# Patient Record
Sex: Male | Born: 2000 | Race: White | Hispanic: No | Marital: Single | Smoking: Never smoker
Health system: Southern US, Academic
[De-identification: ages and names within clinical notes are randomized; demographics above are authoritative.]

## PROBLEM LIST (undated history)

## (undated) DIAGNOSIS — J309 Allergic rhinitis, unspecified: Secondary | ICD-10-CM

## (undated) DIAGNOSIS — F409 Phobic anxiety disorder, unspecified: Secondary | ICD-10-CM

## (undated) DIAGNOSIS — F909 Attention-deficit hyperactivity disorder, unspecified type: Secondary | ICD-10-CM

## (undated) DIAGNOSIS — H18609 Keratoconus, unspecified, unspecified eye: Secondary | ICD-10-CM

## (undated) HISTORY — DX: Keratoconus, unspecified, unspecified eye: H18.609

## (undated) HISTORY — DX: Allergic rhinitis, unspecified: J30.9

## (undated) HISTORY — DX: Attention-deficit hyperactivity disorder, unspecified type: F90.9

## (undated) HISTORY — PX: ADENOIDECTOMY: SUR15

## (undated) HISTORY — PX: TONSILLECTOMY: SUR1361

## (undated) HISTORY — PX: HX TONSILLECTOMY: SHX27

## (undated) HISTORY — PX: HX WISDOM TEETH EXTRACTION: SHX21

## (undated) HISTORY — DX: Phobic anxiety disorder, unspecified: F40.9

---
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
## 2018-09-21 HISTORY — PX: HX CORNEAL TRANSPLANT: SHX108

## 2021-03-27 ENCOUNTER — Other Ambulatory Visit: Payer: Self-pay

## 2021-03-27 ENCOUNTER — Ambulatory Visit: Payer: Self-pay | Attending: Family | Admitting: Family

## 2021-03-27 ENCOUNTER — Encounter (HOSPITAL_BASED_OUTPATIENT_CLINIC_OR_DEPARTMENT_OTHER): Payer: Self-pay | Admitting: Family

## 2021-03-27 VITALS — BP 112/74 | HR 70 | Ht 69.72 in | Wt 179.7 lb

## 2021-03-27 DIAGNOSIS — Z131 Encounter for screening for diabetes mellitus: Secondary | ICD-10-CM | POA: Insufficient documentation

## 2021-03-27 DIAGNOSIS — Z0000 Encounter for general adult medical examination without abnormal findings: Secondary | ICD-10-CM | POA: Insufficient documentation

## 2021-03-27 DIAGNOSIS — Z13220 Encounter for screening for lipoid disorders: Secondary | ICD-10-CM | POA: Insufficient documentation

## 2021-03-27 DIAGNOSIS — F909 Attention-deficit hyperactivity disorder, unspecified type: Secondary | ICD-10-CM | POA: Insufficient documentation

## 2021-03-27 DIAGNOSIS — F39 Unspecified mood [affective] disorder: Secondary | ICD-10-CM | POA: Insufficient documentation

## 2021-03-27 HISTORY — DX: Unspecified mood (affective) disorder (CMS HCC): F39

## 2021-03-27 NOTE — Progress Notes (Signed)
CLINIC HISTORY AND PHYSICAL  FAMILY MEDICINE, [facility]  [address] [place] [address]    Bradley Anderson  MRN: [id number] DOB: 2001/05/14  Date of Service: 03/27/2021  PCP: Thyra Breed, APRN,NP-C     CHIEF COMPLAINT  Chief Complaint   Patient presents with   . Establish Care     HPI  Bradley Anderson is a 20 y.o. male who presents to clinic for the above chief complaint. Has no concerns about their body or health today. Eating and drinking well. Feels well and at their baseline. See A/P for medication review and hx regarding. No red flag symptoms. See ROS for additional details.    Review of Systems   Constitutional: Negative for appetite change, chills and fever.   Respiratory: Negative for cough, chest tightness, shortness of breath and wheezing.    Cardiovascular: Negative for chest pain.   Gastrointestinal: Negative for abdominal pain, blood in stool, constipation, diarrhea, nausea and vomiting.   Neurological: Negative for dizziness, speech difficulty, weakness and numbness.   Psychiatric/Behavioral: Negative for confusion.     Active Problem List:  Patient Active Problem List   Diagnosis   . ADHD (attention deficit hyperactivity disorder)   . Mood disorder (CMS [facility])     Past Medical Hx:  Past Medical History:   Diagnosis Date   . ADHD (attention deficit hyperactivity disorder)    . Mood disorder (CMS HCC) 03/27/2021     Past Surgical Hx:  Past Surgical History:   Procedure Laterality Date   . HX CORNEAL TRANSPLANT Right 2020   . HX TONSILLECTOMY       Family Hx:  Family Medical History:     Problem Relation (Age of Onset)    ADHD/ADD Father    Healthy Half-Brother    No Known Problems Mother        Past Social Hx:  Social History     Tobacco Use   . Smoking status: Never Smoker   . Smokeless tobacco: Never Used   Substance Use Topics   . Alcohol use: Never     Medications:  Current Outpatient Medications   Medication Sig   . buPROPion (WELLBUTRIN SR) 150 mg Oral tablet sustained-release 12 hr  Take 150 mg by mouth Once a day   . methylphenidate HCl 50 mg Oral Cap,Sprinkle,Multiphasic 40-60 Take 50 mg by mouth Once a day     Allergies:  No Known Allergies    OBJECTIVE    Weight: 81.5 kg (179 lb 10.8 oz)  Vitals:    03/27/21 0746   BP: 112/74   Pulse: 70   Weight: 81.5 kg (179 lb 10.8 oz)   Height: 1.771 m (5' 9.72")   BMI: 26.04     Weight/BMI:  Wt Readings from Last 5 Encounters:   03/27/21 81.5 kg (179 lb 10.8 oz)        Physical Exam  Vitals reviewed.   Constitutional:       General: He is not in acute distress.     Appearance: Normal appearance. He is not toxic-appearing.   HENT:      Head: Normocephalic and atraumatic.      Right Ear: Tympanic membrane, ear canal and external ear normal.      Left Ear: Tympanic membrane, ear canal and external ear normal.      Nose: Nose normal.   Eyes:      Conjunctiva/sclera: Conjunctivae normal.      Pupils: Pupils are equal, round, and reactive  to light.   Cardiovascular:      Rate and Rhythm: Normal rate and regular rhythm.      Pulses: Normal pulses.      Heart sounds: Normal heart sounds.   Pulmonary:      Effort: Pulmonary effort is normal. No respiratory distress.      Breath sounds: Normal breath sounds.   Abdominal:      General: Abdomen is flat.      Palpations: Abdomen is soft.   Musculoskeletal:         General: Normal range of motion.      Cervical back: Normal range of motion and neck supple. No tenderness.      Right lower leg: No edema.      Left lower leg: No edema.   Skin:     General: Skin is warm and dry.   Neurological:      General: No focal deficit present.      Mental Status: He is alert and oriented to person, place, and time. Mental status is at baseline.   Psychiatric:         Attention and Perception: Attention and perception normal.         Mood and Affect: Mood normal. Affect is flat.         Speech: Speech normal.         Behavior: Behavior normal.         Thought Content: Thought content normal.         Cognition and Memory: Cognition  normal.         Judgment: Judgment normal.     Data Reviewed:    Lab A1C Results:  No results found for: HA1C    POCT A1C Results:  No flowsheet data found.    No results found for: POCTHA1C    No results found for: TRIG, HDLCHOL, LDLCHOL, CHOLESTEROL  The ASCVD Risk score Denman George DC Jr., et al., 2013) failed to calculate for the following reasons:    The 2013 ASCVD risk score is only valid for ages 37 to 100    PHQ Questionnaire  Little interest or pleasure in doing things.: Not at all  Feeling down, depressed, or hopeless: Not at all  PHQ 2 Total: 0  Trouble falling or staying asleep, or sleeping too much.: Not at all  Feeling tired or having little energy: Several Days  Poor appetite or overeating: Not at all  Feeling bad about yourself/ that you are a failure in the past 2 weeks?: Not at all  Trouble concentrating on things in the past 2 weeks?: Several Days  Moving/Speaking slowly or being fidgety or restless  in the past 2 weeks?: Not at all  Thoughts that you would be better off DEAD, or of hurting yourself in some way.: Not at all  If you checked off any problems, how difficult have these problems made it for you to do your work, take care of things at home, or get along with other people?: Somewhat difficult  PHQ 9 Total: 2  Interpretation of Total Score: 0-4 No depression    Most Recent PHQ-9 Scores 03/27/2021   PHQ 9 Total 2        GAD-7 Questionnaire 03/27/2021   Feeling nervous,anxious,on edge 0   Not being able to stop or control worrying 0   Worrying too much about different things 0   Trouble relaxing 0   Being so restless that it is hard to sit still 0  Becoming easily annoyed or irritable 0   Feeling afraid as if something awful might happen 0   How difficult have these problems made it for you to work, take care of things at home, or get along with other people? Not difficult at all   Gad-7 Score Total 0   Interpretation 0-4, normal        Most Recent GAD-7 Scores 03/27/2021   Gad-7 Score Total 0         Depression screening is negative.       reports that he has never smoked. He has never used smokeless tobacco. He reports that he does not drink alcohol and does not use drugs.  -[place]upation[place], not currently employed   -Alcohol Misuse: No  Body mass index is 25.99 kg/m.  -Diet History: 50% healthy, 50% unhealthy.  -Exercise History: typically does body weight exercises at home, 2/week.  -Sleep: 8 hours a night.   -Have you been told you snore or stop breathing at nighttime? No  -STI Screening-Any known exposure/risk to STI's: No  -Routine Eye Exams: No  -Routine Dental Exams: No    Cancer Screening in Male:   -Colorectal cancer: Beginning at age 22. Patient less than 22 yrs old without other indications for colon cancer screening.  -Lung Cancer >50 yr age screen if 20 pack yr current or former quit in past 15 yrs  N\A  -Vit D testing (if community dwelling or high fall risk):No  -Aortic aneurysm screening (if >65 yrs and ever smoked) No  -Prostate cancer PSA testing :discussed AUA guidelines: Shared decision-making completed will not have PSA testing completed at this time  -PSA testing indications discussed. a routine screening interval of two years preferred    Routine screening  * ages 1140 to 54 years at average risk is not recommended.  * ages 2055 to 6369 years, the decision to undergo PSA screening involves weighing the benefits of preventing prostate cancer mortality in 1 man for every 1,000 men screened over a decade against the known potential harms associated with screening and treatment.    *over age 22 or less than a 10 year life expectancy routine PSA screening is not recommended in men    Health Maintenance   Topic Date Due   . Covid-19 Vaccine (1) Never done   . HPV Vaccines (1 - Male 2-dose series) Never done   . Adult Tdap-Td (1 - Tdap) Never done   . Influenza Vaccine (1) 05/22/2021   . Depression Screening  03/27/2022   . Meningococcal Vaccine  Aged Out   . Pneumococcal Vaccine, Age  63-64  Aged Out   . HIV Screening  Discontinued        Assessment & Plan:   Bradley HidalgoCaleb Anderson is a 20 y.o. male, here today for:     1. Annual physical exam  - Age appropriate screening completed/ordered today. See above for details.  - Patient counseled today about the following preventive/treatment guideline for their age: safe sex practices, smoking/tobacco use/cessation, partner violence, diet, exercise, healthy body weight.  - Recommend a minimum of thirty minutes of moderate exercise (i.e. walking at a brisk pace, bicycling, etc.) 4-5 days per week for heart health and to assist in healthy metabolism.  - GLUCOSE FASTING; Future  - LIPID PANEL; Future  - defers HPV and Tdap vaccination, unsure if he received previously, records [place].  - did receive COVID vaccination but  does not have record, will upload to MyChart when available.    2. Screening for lipid disorders  - LIPID PANEL; Future    3. Screening for diabetes mellitus  - GLUCOSE FASTING; Future    4. Mood disorder (CMS HCC)  Chronic, controlled.  - does not follow with therapy/counseling. PHQ/GAD as above. Feels mood is well controlled on his Wellbutrin, does not need refills at this time.    5. ADHD (attention deficit hyperactivity disorder)  Chronic, controlled.   - currently on methylphenidate daily, doing well on medication, no s/e or concerns, will need refills "within a week or two", he does not have his previous ADHD testing results with him, requested from previous PCP Dr. Hulan Fray in [place]. Instructed to bring to next visit with him for ADHD medication refills. Verbalized understanding.    Return in about 1 week (around 04/03/2021) for ADHD medication refill, needs provider able to prescribe.     Follow up with PCP in 1 year for wellness (around 03/27/2022).  This document was generated using a voice recognition system and transcription. All documents are proofed as best as possible, but it may have  misspelled words, incorrect words, or syntax and grammatical errors because of the imperfect nature of the system.    Dr. Thyra Breed, DNP, FNP-C    [facility]  Family Medicine, [facility]  [address]  [place], [place] [address]  [phone]

## 2021-03-31 ENCOUNTER — Other Ambulatory Visit (HOSPITAL_BASED_OUTPATIENT_CLINIC_OR_DEPARTMENT_OTHER): Payer: Self-pay | Admitting: Family

## 2021-03-31 DIAGNOSIS — Z947 Corneal transplant status: Secondary | ICD-10-CM

## 2021-03-31 MED ORDER — PREDNISOLONE ACETATE 1 % EYE DROPS,SUSPENSION
1.0000 [drp] | Freq: Every day | OPHTHALMIC | 11 refills | Status: DC
Start: 2021-03-31 — End: 2023-01-22

## 2021-03-31 MED ORDER — OFLOXACIN 0.3 % EYE DROPS
1.0000 [drp] | Freq: Three times a day (TID) | OPHTHALMIC | 11 refills | Status: DC
Start: 2021-03-31 — End: 2022-12-10

## 2021-03-31 MED ORDER — TIMOLOL MALEATE 0.5 % EYE DROPS
1.0000 [drp] | Freq: Every day | OPHTHALMIC | 11 refills | Status: DC
Start: 2021-03-31 — End: 2022-12-10

## 2021-04-03 ENCOUNTER — Encounter (HOSPITAL_BASED_OUTPATIENT_CLINIC_OR_DEPARTMENT_OTHER): Payer: Self-pay | Admitting: Family

## 2021-04-17 ENCOUNTER — Encounter (HOSPITAL_BASED_OUTPATIENT_CLINIC_OR_DEPARTMENT_OTHER): Payer: Self-pay | Admitting: Family Medicine

## 2021-05-08 ENCOUNTER — Other Ambulatory Visit: Payer: Self-pay

## 2021-05-08 ENCOUNTER — Ambulatory Visit: Payer: Self-pay | Attending: Family Medicine | Admitting: Family Medicine

## 2021-05-08 ENCOUNTER — Encounter (HOSPITAL_BASED_OUTPATIENT_CLINIC_OR_DEPARTMENT_OTHER): Payer: Self-pay | Admitting: Family Medicine

## 2021-05-08 VITALS — BP 122/76 | HR 78 | Temp 97.8°F | Ht 69.0 in | Wt 181.4 lb

## 2021-05-08 DIAGNOSIS — F39 Unspecified mood [affective] disorder: Secondary | ICD-10-CM | POA: Insufficient documentation

## 2021-05-08 DIAGNOSIS — H6123 Impacted cerumen, bilateral: Secondary | ICD-10-CM | POA: Insufficient documentation

## 2021-05-08 DIAGNOSIS — F909 Attention-deficit hyperactivity disorder, unspecified type: Secondary | ICD-10-CM | POA: Insufficient documentation

## 2021-05-08 MED ORDER — CARBAMIDE PEROXIDE 6.5 % EAR DROPS
10.0000 [drp] | Freq: Two times a day (BID) | OTIC | 1 refills | Status: DC
Start: 2021-05-08 — End: 2021-08-19

## 2021-05-08 MED ORDER — METHYLPHENIDATE CD 50 MG BIPHASIC 30-70 CAPSULE,EXTENDED RELEASE
50.0000 mg | ORAL_CAPSULE | Freq: Every day | ORAL | 0 refills | Status: AC
Start: 2021-06-07 — End: 2021-07-07

## 2021-05-08 MED ORDER — METHYLPHENIDATE CD 50 MG BIPHASIC 30-70 CAPSULE,EXTENDED RELEASE
50.0000 mg | ORAL_CAPSULE | Freq: Every day | ORAL | 0 refills | Status: DC
Start: 2021-07-07 — End: 2021-08-19

## 2021-05-08 MED ORDER — METHYLPHENIDATE CD 50 MG BIPHASIC 30-70 CAPSULE,EXTENDED RELEASE
50.0000 mg | ORAL_CAPSULE | Freq: Every day | ORAL | 0 refills | Status: AC
Start: 2021-05-08 — End: 2021-06-07

## 2021-05-08 MED ORDER — BUPROPION HCL XL 150 MG 24 HR TABLET, EXTENDED RELEASE
150.0000 mg | ORAL_TABLET | Freq: Every day | ORAL | 3 refills | Status: DC
Start: 2021-05-08 — End: 2021-10-16

## 2021-05-08 NOTE — Progress Notes (Signed)
CLINIC PROGRESS NOTE  FAMILY MEDICINE, [facility]  [address] [place] [address]    Bradley Anderson  MRN: [id number] DOB: Mar 04, 2001  Date of Service: 05/08/2021    CHIEF COMPLAINT  Chief Complaint   Patient presents with   . Establish Care   . Annual Wellness Exam   . ADHD     HPI  Bradley Anderson is a 20 y.o. male who presents to clinic for:   . ADHD: reports sx well-controlled on methylphenidate 50 mg CR po daily, no concerns, no ADR; reviewed prev PCP records; outside facility records also show prev trials of strattera, intuniv, vyvanse  . Mood disorder: reports sx well-controlled on wellbutrin 150mg  po daily, no concerns  . Bilat ear fullness, no pain    Medical History:  Problem list, PMHx, PSHx, FHx, SHx, allergies, and current Rx were reviewed and updated as appropriate.    OBJECTIVE    Weight: 82.3 kg (181 lb 7 oz)  Vitals:    05/08/21 1131   BP: 122/76   Pulse: 78   Temp: 36.6 C (97.8 F)   TempSrc: Thermal Scan   SpO2: 96%   Weight: 82.3 kg (181 lb 7 oz)   Height: 1.753 m (5\' 9" )   BMI: 26.85     Physical Exam  Constitutional:       General: He is not in acute distress.     Appearance: He is not ill-appearing, toxic-appearing or diaphoretic.   Cardiovascular:      Rate and Rhythm: Normal rate and regular rhythm.      Heart sounds: Normal heart sounds.   Pulmonary:      Effort: Pulmonary effort is normal. No respiratory distress.   Neurological:      Mental Status: He is alert.   Psychiatric:         Attention and Perception: Attention normal.         Mood and Affect: Mood and affect normal.         Speech: Speech normal.         Behavior: Behavior is cooperative.       Assessment & Plan:   Bradley Anderson is a 20 y.o. male, here today for:  1. ADHD (attention deficit hyperactivity disorder)  [facility] was last reviewed on 05/08/2021 12:29 PM by 36 and result was REVIEWED PDMP. Based on this, I have no concerns for prescribing controlled substances at this time.    - Methylphenidate  50 mg Oral Capsule, Multiphasic Rel.30-70; Take 1 Capsule (50 mg total) by mouth Once a day for 30 days  Dispense: 30 Capsule; Refill: 0  - Methylphenidate 50 mg Oral Capsule, Multiphasic Rel.30-70; Take 1 Capsule (50 mg total) by mouth Once a day for 30 days  Dispense: 30 Capsule; Refill: 0  - Methylphenidate 50 mg Oral Capsule, Multiphasic Rel.30-70; Take 1 Capsule (50 mg total) by mouth Once a day for 30 days  Dispense: 30 Capsule; Refill: 0    2. Mood disorder (CMS HCC)  - Cont wellbutrin XL 150mg  po daily, refilled     3. Bilateral impacted cerumen  - Refer to [facility]; Future  - Begin trial of carbamide peroxide (DEBROX) 6.5 % Otic Drops; Instill 10 Drops into both ears Twice daily - in morning and at bedtime for 4 days  Dispense: 15 mL; Refill: 1    Return in about 3 months (around 08/08/2021) for Rx check.    Kaisha Wachob J. [facility], MD  [place]

## 2021-06-17 ENCOUNTER — Ambulatory Visit (HOSPITAL_BASED_OUTPATIENT_CLINIC_OR_DEPARTMENT_OTHER): Payer: Self-pay | Admitting: Family

## 2021-06-17 NOTE — Telephone Encounter (Addendum)
Called [facility] and spoke with Bradley November, Pharmacist, who states he has prescriptions for this medication for this patient and that he can fill the medication today.    Patient called and notified of this and states understanding. Informed him that 3 separate prescriptions are sent in for all 30 day supplies and that he will need to be seen in office every 3 months to obtain prescriptions for this. Informed him next month he can just call the Pharmacy to fill the next prescription. He states understanding. Bradley Carol, RN  06/17/2021, 12:28         Regarding: Rx refill  ----- Message from Bradley [facility] sent at 06/17/2021 12:02 PM EDT -----  Bradley Breed, APRN,NP-C  Pt is requesting a refill on the medication below.      Methylphenidate 50 mg Oral Capsule, Multiphasic Rel.30-70 30 Capsule 0 07/07/2021 08/06/2021   Sig - Route: Take 1 Capsule (50 mg total) by mouth Once a day for 30 days - Oral   Sent to pharmacy as: methylphenidate CD 50 mg biphasic 30-70 capsule,extended release   Class: E-Rx   Earliest Fill Date: 07/07/2021     Preferred Pharmacy     [id number] [place],  - [address]    [address] [place] [address]    Phone: [phone] Fax: [phone]    Hours: Not open 24 hours      Thank You Rene Kocher

## 2021-07-29 ENCOUNTER — Ambulatory Visit (HOSPITAL_BASED_OUTPATIENT_CLINIC_OR_DEPARTMENT_OTHER): Payer: Self-pay | Admitting: Family Medicine

## 2021-07-29 NOTE — Telephone Encounter (Addendum)
Patient called and he states he is already out of Methylphenidate. States he has not picked up October prescription from 10/17. He was unaware separate prescriptions were sent in at his last visit. [facility] [place] to see if they have the prescription that was to start 10/17 and ask they fill that. If not, asked that he return call to [place]. Informed him new prescriptions will be written at his upcoming appointment on 08/08/21. He is agreeable and states understanding.  Willadean Carol, RN  07/29/2021, 10:33          Regarding: Rx refill  ----- Message from Donald Pore [facility] sent at 07/29/2021  8:40 AM EST -----  Julian Reil, MD    Pt is requesting a refill on the medication below       Methylphenidate 50 mg Oral Capsule, Multiphasic Rel.30-70 30 Capsule 0 07/07/2021 08/06/2021   Sig - Route: Take 1 Capsule (50 mg total) by mouth Once a day for 30 days - Oral   Sent to pharmacy as: methylphenidate CD 50 mg biphasic 30-70 capsule,extended release   Class: E-Rx   Earliest Fill Date: 07/07/2021     Preferred Pharmacy     [id number] [place], [place] - [address]    [address] [place] [address]    Phone: [phone] Fax: [phone]    Hours: Not open 24 hours      Thank You Rene Kocher

## 2021-08-08 ENCOUNTER — Encounter (HOSPITAL_BASED_OUTPATIENT_CLINIC_OR_DEPARTMENT_OTHER): Payer: Self-pay | Admitting: Family Medicine

## 2021-08-19 ENCOUNTER — Other Ambulatory Visit: Payer: Self-pay

## 2021-08-19 ENCOUNTER — Ambulatory Visit: Payer: Self-pay | Attending: Family Medicine | Admitting: Family Medicine

## 2021-08-19 ENCOUNTER — Encounter (HOSPITAL_BASED_OUTPATIENT_CLINIC_OR_DEPARTMENT_OTHER): Payer: Self-pay | Admitting: Family Medicine

## 2021-08-19 VITALS — BP 124/78 | HR 65 | Temp 98.1°F | Ht 69.0 in | Wt 185.8 lb

## 2021-08-19 DIAGNOSIS — F909 Attention-deficit hyperactivity disorder, unspecified type: Secondary | ICD-10-CM | POA: Insufficient documentation

## 2021-08-19 DIAGNOSIS — F39 Unspecified mood [affective] disorder: Secondary | ICD-10-CM | POA: Insufficient documentation

## 2021-08-19 DIAGNOSIS — H6123 Impacted cerumen, bilateral: Secondary | ICD-10-CM | POA: Insufficient documentation

## 2021-08-19 MED ORDER — METHYLPHENIDATE CD 50 MG BIPHASIC 30-70 CAPSULE,EXTENDED RELEASE
50.0000 mg | ORAL_CAPSULE | Freq: Every day | ORAL | 0 refills | Status: AC
Start: 2021-08-19 — End: 2021-09-18

## 2021-08-19 MED ORDER — CARBAMIDE PEROXIDE 6.5 % EAR DROPS
10.0000 [drp] | Freq: Two times a day (BID) | OTIC | 1 refills | Status: AC
Start: 2021-08-19 — End: 2021-08-23

## 2021-08-19 MED ORDER — METHYLPHENIDATE CD 50 MG BIPHASIC 30-70 CAPSULE,EXTENDED RELEASE
50.0000 mg | ORAL_CAPSULE | Freq: Every day | ORAL | 0 refills | Status: DC
Start: 2021-10-17 — End: 2021-10-16

## 2021-08-19 MED ORDER — METHYLPHENIDATE CD 50 MG BIPHASIC 30-70 CAPSULE,EXTENDED RELEASE
50.0000 mg | ORAL_CAPSULE | Freq: Every day | ORAL | 0 refills | Status: DC
Start: 2021-09-18 — End: 2021-11-17

## 2021-08-19 NOTE — Progress Notes (Signed)
CLINIC PROGRESS NOTE  FAMILY MEDICINE, [facility]  [address] [place] [address]    Kadrian Partch  MRN: [id number] DOB: 04-May-2001  Date of Service: 08/19/2021    CHIEF COMPLAINT  Chief Complaint   Patient presents with   . Follow Up     ADD     HPI  Jerol Rufener is a 20 y.o. male who presents to clinic for:   . ADHD: reports sx well-controlled on methylphenidate 50 mg CR po daily, no concerns, no ADR  . Mood disorder: reports sx well-controlled on wellbutrin 150mg  XL po daily, no concerns  . Bilateral cerumen impaction: patient was unable to pick up script and it expired so is requesting new script, also he reports ENT possibly reached out to him but he did not get a chance to follow up with that appointment    Medical History:  Problem list, PMHx, PSHx, FHx, SHx, allergies, and current Rx were reviewed and updated as appropriate.    OBJECTIVE    Weight: 84.3 kg (185 lb 13.6 oz)  Vitals:    08/19/21 0949   BP: 124/78   Pulse: 65   Temp: 36.7 C (98.1 F)   TempSrc: Thermal Scan   SpO2: 98%   Weight: 84.3 kg (185 lb 13.6 oz)   Height: 1.753 m (5\' 9" )   BMI: 27.5     Physical Exam  Constitutional:       General: He is not in acute distress.     Appearance: He is not ill-appearing, toxic-appearing or diaphoretic.   HENT:      Right Ear: There is impacted cerumen.      Left Ear: There is impacted cerumen.   Cardiovascular:      Rate and Rhythm: Normal rate and regular rhythm.      Heart sounds: Normal heart sounds.   Pulmonary:      Effort: Pulmonary effort is normal. No respiratory distress.      Breath sounds: Normal breath sounds.   Neurological:      Mental Status: He is alert.   Psychiatric:         Mood and Affect: Mood normal.         Behavior: Behavior normal.         Thought Content: Thought content normal.         Judgment: Judgment normal.       Assessment & Plan:   Jakoby Melendrez is a 20 y.o. male, here today for:  1. ADHD (attention deficit hyperactivity disorder)  PDMP reviewed, refill  x3  - Methylphenidate 50 mg Oral Capsule, Multiphasic Rel.30-70; Take 1 Capsule (50 mg total) by mouth Once a day for 30 days  Dispense: 30 Capsule; Refill: 0  - Methylphenidate 50 mg Oral Capsule, Multiphasic Rel.30-70; Take 1 Capsule (50 mg total) by mouth Once a day for 30 days  Dispense: 30 Capsule; Refill: 0  - Methylphenidate 50 mg Oral Capsule, Multiphasic Rel.30-70; Take 1 Capsule (50 mg total) by mouth Once a day for 30 days  Dispense: 30 Capsule; Refill: 0    2. Bilateral impacted cerumen  - Refill carbamide peroxide (DEBROX) 6.5 % Otic Drops; Instill 10 Drops into both ears Twice daily - in morning and at bedtime for 4 days  Dispense: 15 mL; Refill: 1  - Advised patient to arrange ENT referral at front desk    3. Mood disorder (CMS HCC)  - Cont wellbutrin 150mg  XL po qd    Return  in about 3 months (around 11/18/2021) for ADHD Rx.    Courtland Coppa J. Cordelia Poche, MD  [facility]

## 2021-10-16 ENCOUNTER — Other Ambulatory Visit (HOSPITAL_BASED_OUTPATIENT_CLINIC_OR_DEPARTMENT_OTHER): Payer: Self-pay | Admitting: Family Medicine

## 2021-10-16 DIAGNOSIS — F909 Attention-deficit hyperactivity disorder, unspecified type: Secondary | ICD-10-CM

## 2021-10-16 DIAGNOSIS — F39 Unspecified mood [affective] disorder: Secondary | ICD-10-CM

## 2021-10-16 MED ORDER — METHYLPHENIDATE CD 50 MG BIPHASIC 30-70 CAPSULE,EXTENDED RELEASE
50.0000 mg | ORAL_CAPSULE | Freq: Every day | ORAL | 0 refills | Status: DC
Start: 2021-10-17 — End: 2021-11-17

## 2021-10-16 MED ORDER — BUPROPION HCL XL 150 MG 24 HR TABLET, EXTENDED RELEASE
150.0000 mg | ORAL_TABLET | Freq: Every day | ORAL | 3 refills | Status: DC
Start: 2021-10-16 — End: 2022-10-07

## 2021-10-16 NOTE — Telephone Encounter (Signed)
Regarding: Hajiran pt// refills  ----- Message from Myrtis Ser sent at 10/16/2021 11:15 AM EST -----  Julian Reil, MD    Pt called in requesting a refill on the following Rx:    Current Outpatient Medications:  buPROPion (WELLBUTRIN XL) 150 mg extended release 24 hr tablet 90 Tablet 3 05/08/2021    Sig - Route: Take 1 Tablet (150 mg total) by mouth Once a day - Oral   Sent to pharmacy as: buPROPion HCL XL 150 mg 24 hr tablet, extended release (WELLBUTRIN XL)     Methylphenidate 50 mg Oral Capsule, Multiphasic Rel.30-70 30 Capsule 0 10/17/2021 11/16/2021   Sig - Route: Take 1 Capsule (50 mg total) by mouth Once a day for 30 days - Oral   Sent to pharmacy as: methylphenidate CD 50 mg biphasic 30-70 capsule,extended release      Preferred Pharmacy     [facility] [address], [place] - [address]    [address] [place] [address]    Phone: [phone] Fax: [phone]    Hours: Not open 24 hours      Thank you so much!  Asher Muir

## 2021-10-16 NOTE — Telephone Encounter (Signed)
Refill Request Note: [facility]    Patient requests refill of Methylphenidate 50 mg Oral Capsule AND WELLBUTRIN.    Last visit at [place]:  08/19/2021  Last My Telemed/ My Chart Visit: Visit date not found     Next pending visit [facility]: 11/17/2021  Was last visit within the past year: YES  If not seen within the last year was appointment scheduled: N/A    Requested Pharmacy: [facility]    Medication pended to provider for approval.    PATIENT CHANGED PHARMACY.    Renetta Chalk, Ambulatory Care Assistant 10/16/2021, 11:57

## 2021-11-04 ENCOUNTER — Ambulatory Visit (HOSPITAL_BASED_OUTPATIENT_CLINIC_OR_DEPARTMENT_OTHER): Payer: Self-pay | Admitting: Family Medicine

## 2021-11-04 NOTE — Nursing Note (Signed)
Refill Request Note: [facility]     Pharmacy requests refill of Methylphenidate .    Last visit at [facility]:  08/19/2021  Last My Telemed/ My Chart Visit: Visit date not found     Next pending visit [facility]: 11/17/2021  Was last visit within the past year: YES  If not seen within the last year was appointment scheduled: YES    [facility]     Medication pended to provider for approval.    Octaviano Glow. Turner Daniels, LPN 2/77/4128, 78:67

## 2021-11-04 NOTE — Telephone Encounter (Signed)
Regarding: hajiran pt // prescription refill  ----- Message from [facility] A Delaurier sent at 11/03/2021  2:17 PM EST -----  Julian Reil, MD    Pharmacy called in requesting a refill for the following Rx:    Methylphenidate 50 mg Oral Capsule, Multiphasic Rel.30-70, Take 1 Capsule (50 mg total) by mouth Once a day for 30 days    Preferred Pharmacy     [facility] [address] [place] - [address]    [address] [place] [address]    Phone: [phone] Fax: [phone]    Hours: Not open 24 hours      Thanks,  Aundra Millet A Delaurier

## 2021-11-17 ENCOUNTER — Ambulatory Visit: Payer: BC Managed Care – PPO | Attending: Family Medicine | Admitting: Family Medicine

## 2021-11-17 ENCOUNTER — Other Ambulatory Visit: Payer: Self-pay

## 2021-11-17 ENCOUNTER — Encounter (HOSPITAL_BASED_OUTPATIENT_CLINIC_OR_DEPARTMENT_OTHER): Payer: Self-pay | Admitting: Family Medicine

## 2021-11-17 VITALS — BP 110/62 | HR 72 | Temp 97.6°F | Ht 69.0 in | Wt 183.2 lb

## 2021-11-17 DIAGNOSIS — F909 Attention-deficit hyperactivity disorder, unspecified type: Secondary | ICD-10-CM | POA: Insufficient documentation

## 2021-11-17 MED ORDER — METHYLPHENIDATE CD 50 MG BIPHASIC 30-70 CAPSULE,EXTENDED RELEASE
50.0000 mg | ORAL_CAPSULE | Freq: Every day | ORAL | 0 refills | Status: DC
Start: 2021-11-17 — End: 2021-11-25

## 2021-11-17 NOTE — Nursing Note (Signed)
[facility]  [place] Controlled Substance Full Name Report Report Date 11/17/2021   From 11/17/2020 To 11/16/2021 Date of Birth November 12, 2000 Prescription Count 5   Last Name Hay First Name Eugine Middle Name                      Patients included in report that appear to match the search criteria.   Last Name First Name Middle Name Gender Address   [facility] [place]  M [address] , [place], [place], [address]            Prescriber Name Prescriber DEA & Zip Dispenser Name Dispenser DEA & Zip Rx Written Date Rx Dispense Date  & Date Sold   Rx Number Product Name MEDD Status Strength Qty Days # of Refill [facility] Payment Type   [facility] [address] [place], [address]   Julian Reil [id number] [phone] [facility], [place]. [id number] [phone] 08/19/2021 09/15/2021 09/17/2021 [id number] Methylphenidate Hydrochloride INACTIVE 50 mg/1 30 30  0/0 CII Insurance   Julian Reil [id number] [phone] [facility], [place]. [id number] [phone] 05/08/2021 07/29/2021 07/31/2021 [id number] Methylphenidate Hydrochloride INACTIVE 50 mg/1 30 30  0/0 CII Insurance   Julian Reil [id number] [phone] [facility], [place]. [id number] [phone] 05/08/2021 06/17/2021 06/20/2021 [id number] Methylphenidate Hydrochloride INACTIVE 50 mg/1 30 30  0/0 CII Insurance   Julian Reil [id number] [phone] [facility], [place]. [id number] [phone] 05/08/2021 05/08/2021 05/09/2021 [id number] Methylphenidate Hydrochloride INACTIVE 50 mg/1 30 30  0/0 CII Insurance   Jonny Ruiz [id number] [address] [facility], [place]. [id number] [phone] 03/28/2021 03/31/2021 04/06/2021 [id number] Methylphenidate Hydrochloride INACTIVE 50 mg/1 30 30  0/0 CII Insurance            Malva Limes, Ambulatory Care Assistant  11/17/2021, 10:41

## 2021-11-17 NOTE — Progress Notes (Signed)
CLINIC PROGRESS NOTE  FAMILY MEDICINE, [facility]  [address] [place] [address]    Bradley Anderson  MRN: [id number] DOB: 2001-06-15  Date of Service: 11/17/2021    CHIEF COMPLAINT  Chief Complaint   Patient presents with   . Follow Up 3 Months   . Medication Check F/U     HPI  Bradley Anderson is a 21 y.o. male who presents to clinic for:   . ADHD: reports sx well-controlled on methylphenidate 50 mg CR po daily, no ADR; was unable to pick up last script due to national shortage issues    Medical History:  Problem list, PMHx, PSHx, FHx, SHx, allergies, and current Rx were reviewed and updated as appropriate.    OBJECTIVE    Weight: 83.1 kg (183 lb 3.2 oz)  Vitals:    11/17/21 1036   BP: 110/62   Pulse: 72   Temp: 36.4 C (97.6 F)   TempSrc: Thermal Scan   SpO2: 97%   Weight: 83.1 kg (183 lb 3.2 oz)   Height: 1.753 m (5\' 9" )   BMI: 27.11     Physical Exam  Constitutional:       General: He is not in acute distress.     Appearance: He is not ill-appearing, toxic-appearing or diaphoretic.   Cardiovascular:      Rate and Rhythm: Normal rate and regular rhythm.      Heart sounds: Normal heart sounds.   Pulmonary:      Effort: Pulmonary effort is normal. No respiratory distress.   Neurological:      Mental Status: He is alert.   Psychiatric:         Attention and Perception: Attention normal.         Mood and Affect: Mood normal.         Speech: Speech normal.         Behavior: Behavior is cooperative.         Thought Content: Thought content normal.       Assessment & Plan:   Bradley Anderson is a 21 y.o. male, here today for:  1. ADHD (attention deficit hyperactivity disorder)  Just sending in 1 script at a time in case we need to switch pharmacies again  - Refill Methylphenidate 50 mg Oral Capsule, Multiphasic Rel.30-70; Take 1 Capsule (50 mg total) by mouth Once a day  Dispense: 30 Capsule; Refill: 0    Return in about 3 months (around 02/14/2022) for ADHD Rx.    Dang Mathison J. 02/16/2022, MD  [facility]

## 2021-11-25 ENCOUNTER — Other Ambulatory Visit (HOSPITAL_BASED_OUTPATIENT_CLINIC_OR_DEPARTMENT_OTHER): Payer: Self-pay | Admitting: Family Medicine

## 2021-11-25 DIAGNOSIS — F909 Attention-deficit hyperactivity disorder, unspecified type: Secondary | ICD-10-CM

## 2021-11-26 MED ORDER — METHYLPHENIDATE CD 50 MG BIPHASIC 30-70 CAPSULE,EXTENDED RELEASE
50.0000 mg | ORAL_CAPSULE | Freq: Every day | ORAL | 0 refills | Status: DC
Start: 2021-11-26 — End: 2022-02-19

## 2021-11-26 NOTE — Telephone Encounter (Signed)
CSAPP reviewed outside EMR. Has had problems with availability issues but CSAPP appropriate.     Lenard Lance, MD 11/26/21 10:20

## 2021-12-09 ENCOUNTER — Ambulatory Visit (HOSPITAL_BASED_OUTPATIENT_CLINIC_OR_DEPARTMENT_OTHER): Payer: Self-pay | Admitting: Family Medicine

## 2021-12-09 ENCOUNTER — Other Ambulatory Visit (HOSPITAL_BASED_OUTPATIENT_CLINIC_OR_DEPARTMENT_OTHER): Payer: Self-pay | Admitting: Family Medicine

## 2021-12-09 NOTE — Telephone Encounter (Addendum)
Called and spoke with pt and his mother. They will call around and let us know what they find out. Thanks.Amber D McFarland, LPN  7/49/4496, 08:35        Recommend advising patient to check into other pharmacies and/or if his primary pharmacy has other doses in stock prior to sending in something else so that I will know what to send and/or where to send it    Nallely Yost J. Cordelia Poche, MD  [facility]

## 2021-12-09 NOTE — Telephone Encounter (Addendum)
Please advise.  Thanks,  Willadean Carol, RN  12/09/2021, 15:37      ----- Message from Geralynn Ochs, LPN sent at 5/40/9811  2:45 PM EDT -----  Regarding: FW: hajiran pt // prescription refill  ----- Message from Reginia Naas Postelwait sent at 12/09/2021 10:46 AM EDT -----      Pt has not has this medication for approx 2 months due to shortage. Is there something else that can be prescribed? Please call Pt and advise    Thank you and have a nice day,    [name]      ----- Message from Sargent A Delaurier sent at 11/03/2021  2:17 PM EST -----  Julian Reil, MD    Pharmacy called in requesting a refill for the following Rx:    Methylphenidate 50 mg Oral Capsule, Multiphasic Rel.30-70, Take 1 Capsule (50 mg total) by mouth Once a day for 30 days    Preferred Pharmacy     [facility] [address] [place] - [address]    [address] [place] [address]    Phone: [phone] Fax: [phone]    Hours: Not open 24 hours      Thanks,  Aundra Millet A Delaurier

## 2021-12-24 ENCOUNTER — Telehealth (HOSPITAL_BASED_OUTPATIENT_CLINIC_OR_DEPARTMENT_OTHER): Payer: Self-pay | Admitting: Family Medicine

## 2021-12-24 NOTE — Telephone Encounter (Signed)
[facility] has been contacted in regards to ADHD Management Medication. Pharmacy Staff stated Adderall is available in multiple dosages. Message regarding this call has been sent to the PCP prior to this documentation.     Geralynn Ochs, LPN  9/0/3009, 23:30

## 2021-12-24 NOTE — Telephone Encounter (Addendum)
See nurse triage encounter from 12/09/21, have not heard anything back from the patient since that time    Please ask patient if they were able to gather any information such as an in stock dose of methylphenidate 40 mg or 60 mg, or perhaps in stock Adderall XR 50 mg -- not sure what to send because any of those options may also be out of stock depending on where the drug is being filled    May need a follow up video visit to discuss further and iron out a plan    Gyneth Hubka J. Cordelia Poche, MD  [facility] Medicine      ----- Message from Geralynn Ochs, LPN sent at 12/20/5828 12:59 PM EDT -----  Regarding: FW: Alyus Mofield pt // prescription refill  ----- Message from Myrtis Ser sent at 12/24/2021 11:42 AM EDT -----  Julian Reil, MD    Pt is following up to see if there is another similar RX that could be prescribed in place of Methylphenidate 50 mg Oral Capsule.    [facility] [address], [place] - [address]  [address] [place] [address]  Phone: [phone] Fax: [phone]    Thank you so much!  Asher Muir     ----- Message from Reginia Naas Postelwait sent at 12/09/2021 10:46 AM EDT -----  Pt has not has this medication for approx 2 months due to shortage. Is there something else that can be prescribed? Please call Pt and advise    Thank you and have a nice day,    [name]      ----- Message from [place] A Delaurier sent at 11/03/2021  2:17 PM EST -----  Julian Reil, MD    Pharmacy called in requesting a refill for the following Rx:    Methylphenidate 50 mg Oral Capsule, Multiphasic Rel.30-70, Take 1 Capsule (50 mg total) by mouth Once a day for 30 days    Preferred Pharmacy     [facility] [address], [place] - [address]    [address] [place] [address]    Phone: [phone] Fax: [phone]    Hours: Not open 24 hours      Thanks,  Aundra Millet A Delaurier

## 2021-12-24 NOTE — Telephone Encounter (Signed)
[place], [place] [address]  - - - - - - - - - - - - - - - - - - - - - - - - - - - - - - - - - -  MyChart Message Encounter      A MyChart Message has been sent to this patient. Documentation regarding reasoning of message to follow.     Bradley Anderson    Message Notes: MyChart message sent regarding the request for a new Rx for ADHD.    Willeen Niece, LPN  [phone], [id number]

## 2021-12-25 ENCOUNTER — Encounter (HOSPITAL_BASED_OUTPATIENT_CLINIC_OR_DEPARTMENT_OTHER): Payer: Self-pay | Admitting: Family Medicine

## 2022-01-01 ENCOUNTER — Encounter (HOSPITAL_COMMUNITY): Payer: Self-pay

## 2022-01-06 ENCOUNTER — Other Ambulatory Visit (HOSPITAL_BASED_OUTPATIENT_CLINIC_OR_DEPARTMENT_OTHER): Payer: Self-pay | Admitting: Family Medicine

## 2022-02-12 ENCOUNTER — Ambulatory Visit (HOSPITAL_BASED_OUTPATIENT_CLINIC_OR_DEPARTMENT_OTHER): Payer: Self-pay | Admitting: Family Medicine

## 2022-02-19 ENCOUNTER — Encounter (HOSPITAL_BASED_OUTPATIENT_CLINIC_OR_DEPARTMENT_OTHER): Payer: Self-pay | Admitting: Family Medicine

## 2022-02-19 ENCOUNTER — Ambulatory Visit: Payer: BC Managed Care – PPO | Attending: Family Medicine | Admitting: Family Medicine

## 2022-02-19 ENCOUNTER — Other Ambulatory Visit: Payer: Self-pay

## 2022-02-19 VITALS — BP 114/62 | HR 61 | Temp 97.9°F | Ht 69.0 in | Wt 170.9 lb

## 2022-02-19 DIAGNOSIS — F39 Unspecified mood [affective] disorder: Secondary | ICD-10-CM | POA: Insufficient documentation

## 2022-02-19 DIAGNOSIS — F909 Attention-deficit hyperactivity disorder, unspecified type: Secondary | ICD-10-CM | POA: Insufficient documentation

## 2022-02-19 MED ORDER — METHYLPHENIDATE CD 50 MG BIPHASIC 30-70 CAPSULE,EXTENDED RELEASE
50.0000 mg | ORAL_CAPSULE | Freq: Every day | ORAL | 0 refills | Status: AC
Start: 2022-03-17 — End: 2022-04-16

## 2022-02-19 MED ORDER — METHYLPHENIDATE CD 50 MG BIPHASIC 30-70 CAPSULE,EXTENDED RELEASE
50.0000 mg | ORAL_CAPSULE | Freq: Every day | ORAL | 0 refills | Status: DC
Start: 2022-04-16 — End: 2022-06-08

## 2022-02-19 MED ORDER — METHYLPHENIDATE CD 50 MG BIPHASIC 30-70 CAPSULE,EXTENDED RELEASE
50.0000 mg | ORAL_CAPSULE | Freq: Every day | ORAL | 0 refills | Status: DC
Start: 2022-05-15 — End: 2022-06-08

## 2022-02-19 NOTE — Progress Notes (Signed)
CLINIC PROGRESS NOTE  FAMILY MEDICINE, [facility]  [address] [place] [address]    Kadan Millstein  MRN: [id number] DOB: 2001/01/28  Date of Service: 02/19/2022    CHIEF COMPLAINT  Chief Complaint   Patient presents with   . Medication Check F/U     HPI  Cannen Dupras is a 21 y.o. male who presents to clinic for:   . ADHD: Rx methylphenidate CD 50 biphasic 30-70, no adr/concerns, sx well-controlled on this dosing, does not want to make any adjustments  . Mood disorder: Rx wellbutrin XL 150mg  po daily, no adr/concerns, feels sx are well-controlled, does not want to make any adjustments    Medical History:  Problem list, PMHx, PSHx, FHx, SHx, allergies, and current Rx were reviewed and updated as appropriate.    OBJECTIVE    Weight: 77.5 kg (170 lb 13.7 oz)  Vitals:    02/19/22 1056   BP: 114/62   Pulse: 61   Temp: 36.6 C (97.9 F)   TempSrc: Thermal Scan   SpO2: 98%   Weight: 77.5 kg (170 lb 13.7 oz)   Height: 1.753 m (5\' 9" )   BMI: 25.28     Physical Exam  Constitutional:       General: He is not in acute distress.     Appearance: He is not ill-appearing, toxic-appearing or diaphoretic.   Cardiovascular:      Rate and Rhythm: Normal rate and regular rhythm.      Heart sounds: Normal heart sounds.   Pulmonary:      Effort: Pulmonary effort is normal. No respiratory distress.   Neurological:      Mental Status: He is alert.   Psychiatric:         Attention and Perception: Attention and perception normal.         Mood and Affect: Mood and affect normal.         Speech: Speech normal.         Behavior: Behavior normal. Behavior is cooperative.         Thought Content: Thought content normal.         Cognition and Memory: Cognition and memory normal.         Judgment: Judgment normal.       Assessment & Plan:   Jontavious Commons is a 21 y.o. male, here today for:  1. ADHD (attention deficit hyperactivity disorder)  PDMP reviewed, refill x3  - methylphenidate CD 50 mg Oral Capsule Biphasic Rel.30-70; Take 1  Capsule (50 mg total) by mouth Once a day for 30 days  Dispense: 30 Capsule; Refill: 0  - methylphenidate CD 50 mg Oral Capsule Biphasic Rel.30-70; Take 1 Capsule (50 mg total) by mouth Once a day for 30 days  Dispense: 30 Capsule; Refill: 0  - methylphenidate CD 50 mg Oral Capsule Biphasic Rel.30-70; Take 1 Capsule (50 mg total) by mouth Once a day for 30 days  Dispense: 30 Capsule; Refill: 0    2. Mood disorder (CMS HCC)  Cont wellbutrin XL 150mg  po daily    Return in about 3 months (around 05/22/2022) for ADHD Rx, mood check.    Gerlene Glassburn J. 21, MD  [facility] Medicine

## 2022-02-19 NOTE — Nursing Note (Signed)
[place] Controlled Substance Full Name Report Report Date 02/19/2022   From 02/19/2021 To 02/18/2022 Date of Birth 06-May-2001 Prescription Count 7   Last Name Sayre First Name [place] Name                      Patients included in report that appear to match the search criteria.   Last Name First Name Middle Name Gender Address   Coward  M [address] [place] , [place], [place], [address]            Prescriber Name Prescriber [facility] Name [place] Rx Written Date Rx Dispense Date  & Date Sold   Rx Number Product Name MEDD Status Strength Qty Days # of Refill [facility] Payment Type   [facility] [place], [place], [place], [place] [id number] 11/17/2021 02/15/2022 02/15/2022 [id number] Methylphenidate Hydrochloride ACTIVE 50 30 30 0/0 CII [address]   Holley Raring [place] [id number] [place] [id number] 11/26/2021 01/03/2022 01/03/2022 [id number] Methylphenidate Hydrochloride INACTIVE 50 30 30 0/0 CII Insurance   Katrina Stack [id number] [place], [place]. [id number] 08/19/2021 09/15/2021 09/17/2021 [id number] Methylphenidate Hydrochloride INACTIVE 50 mg/1 30 30  0/0 CII Insurance   Katrina Stack [id number] [place], [place]. [id number] 05/08/2021 07/29/2021 07/31/2021 [id number] Methylphenidate Hydrochloride INACTIVE 50 mg/1 30 30  0/0 CII Insurance   Katrina Stack [id number] [place], [place]. [id number] 05/08/2021 06/17/2021 06/20/2021 [id number] Methylphenidate Hydrochloride INACTIVE 50 mg/1 30 30  0/0 CII Insurance   Katrina Stack [id number] [place], [place]. [id number] 05/08/2021 05/08/2021 05/09/2021 [id number] Methylphenidate Hydrochloride INACTIVE 50 mg/1 30 30  0/0 CII Insurance   Bryson Ha [id number] [place], [place]. [id number] 03/28/2021 03/31/2021  04/06/2021 [id number] Methylphenidate Hydrochloride INACTIVE 50 mg/1 30 30  0/0 CII Insurance            Renetta Chalk, Ambulatory Care Assistant

## 2022-04-27 ENCOUNTER — Encounter (HOSPITAL_COMMUNITY): Payer: Self-pay

## 2022-05-07 ENCOUNTER — Telehealth (HOSPITAL_BASED_OUTPATIENT_CLINIC_OR_DEPARTMENT_OTHER): Payer: Self-pay | Admitting: Family Medicine

## 2022-05-07 ENCOUNTER — Other Ambulatory Visit (HOSPITAL_BASED_OUTPATIENT_CLINIC_OR_DEPARTMENT_OTHER): Payer: Self-pay | Admitting: Family Medicine

## 2022-05-22 ENCOUNTER — Ambulatory Visit (HOSPITAL_BASED_OUTPATIENT_CLINIC_OR_DEPARTMENT_OTHER): Payer: BC Managed Care – PPO | Admitting: Family Medicine

## 2022-05-26 ENCOUNTER — Ambulatory Visit (HOSPITAL_BASED_OUTPATIENT_CLINIC_OR_DEPARTMENT_OTHER): Payer: BC Managed Care – PPO | Admitting: Student in an Organized Health Care Education/Training Program

## 2022-06-08 ENCOUNTER — Other Ambulatory Visit: Payer: Self-pay

## 2022-06-08 ENCOUNTER — Encounter (HOSPITAL_BASED_OUTPATIENT_CLINIC_OR_DEPARTMENT_OTHER): Payer: Self-pay | Admitting: Student in an Organized Health Care Education/Training Program

## 2022-06-08 ENCOUNTER — Ambulatory Visit
Payer: BC Managed Care – PPO | Attending: Student in an Organized Health Care Education/Training Program | Admitting: Student in an Organized Health Care Education/Training Program

## 2022-06-08 VITALS — BP 118/64 | HR 70 | Temp 97.9°F | Wt 177.5 lb

## 2022-06-08 DIAGNOSIS — Z79899 Other long term (current) drug therapy: Secondary | ICD-10-CM

## 2022-06-08 DIAGNOSIS — F39 Unspecified mood [affective] disorder: Secondary | ICD-10-CM

## 2022-06-08 DIAGNOSIS — F909 Attention-deficit hyperactivity disorder, unspecified type: Secondary | ICD-10-CM

## 2022-06-08 MED ORDER — METHYLPHENIDATE CD 50 MG BIPHASIC 30-70 CAPSULE,EXTENDED RELEASE
50.0000 mg | ORAL_CAPSULE | Freq: Every day | ORAL | 0 refills | Status: DC
Start: 2022-08-07 — End: 2022-07-13

## 2022-06-08 MED ORDER — METHYLPHENIDATE CD 50 MG BIPHASIC 30-70 CAPSULE,EXTENDED RELEASE
1.0000 | ORAL_CAPSULE | Freq: Every day | ORAL | 0 refills | Status: DC
Start: 2022-06-08 — End: 2022-09-10

## 2022-06-08 MED ORDER — METHYLPHENIDATE CD 50 MG BIPHASIC 30-70 CAPSULE,EXTENDED RELEASE
50.0000 mg | ORAL_CAPSULE | Freq: Every day | ORAL | 0 refills | Status: AC
Start: 2022-07-08 — End: 2022-08-07

## 2022-06-08 NOTE — Progress Notes (Signed)
CLINIC PROGRESS NOTE  FAMILY MEDICINE, [facility]  [place] [address]    Creek Furr  07-Sep-2001  [id number]    Date of Service: 06/08/2022  8:20 AM EDT    Chief complaint:   Chief Complaint   Patient presents with    ADHD       Subjective:   Bradley Anderson is a 21 y.o. male patient who presents for ADHD and mood disorder follow-up.  Tolerating medications well without side effects.  Denies any sleep or appetite changes on methylphenidate.  Feels that this medication helps him get through his work day.  No other concerns today.    REVIEW OF SYSTEMS:  ROS reviewed and negative.    Health Maintenance Due   Topic Date Due    Hepatitis B Vaccine (1 of 3 - 3-dose series) Never done    Covid-19 Vaccine (3 - Moderna series) 07/10/2020    Adult Tdap-Td (7 - Td or Tdap) 05/27/2021    Depression Screening  03/27/2022    Influenza Vaccine (1) Never done       Current Outpatient Medications   Medication Sig    buPROPion (WELLBUTRIN XL) 150 mg extended release 24 hr tablet Take 1 Tablet (150 mg total) by mouth Once a day    [START ON 08/07/2022] methylphenidate CD 50 mg Oral Capsule, Multiphasic Rel.30-70 Biphasic Rel.30-70 Take 1 Capsule (50 mg total) by mouth Once a day for 30 days    [START ON 07/08/2022] methylphenidate CD 50 mg Oral Capsule, Multiphasic Rel.30-70 Biphasic Rel.30-70 Take 1 Capsule (50 mg total) by mouth Once a day for 30 days    methylphenidate CD 50 mg Oral Capsule, Multiphasic Rel.30-70 Biphasic Rel.30-70 Take 1 Capsule (50 mg total) by mouth Once a day    ofloxacin (OCUFLOX) 0.3 % Ophthalmic Drops Administer 1 Drop into the right eye Three times a day    prednisoLONE acetate (PRED FORTE) 1 % Ophthalmic Drops, Suspension Administer 1 Drop into the right eye Once a day    timoloL maleate (TIMOPTIC) 0.5 % Ophthalmic Drops Instill 1 Drop into both eyes Once a day       Objective:   BP 118/64   Pulse 70   Temp 36.6 C (97.9 F)   Wt 80.5 kg (177 lb 7.5 oz)   SpO2 98%   BMI  26.21 kg/m     General appearance: alert, no acute distress  Lungs: clear to auscultation bilaterally, no wheezes or crackles  Heart: RRR, no murmur appreciated  Abdomen: soft, non-tender, non-distended, BS+, no guarding  Extremities: atraumatic, no edema  Neuro: grossly normal, gait steady    Diabetes Monitors  A1C - Glucose - Lipids Microalbumin   No results for input(s): HA1C, POCTHA1C, GLUCOSEFAST, CHOLESTEROL, HDLCHOL, LDLCHOL, LDLCHOLDIR, TRIG in the last 13140 hours. No results for input(s): MICALBRNUR, MICALBCRERAT in the last 13140 hours.     Retinal Exam Date: Not Found DM Retinopathy - Negative Date  Last Foot Exam: Not Found      The ASCVD Risk score (Arnett DK, et al., 2019) failed to calculate for the following reasons:    The 2019 ASCVD risk score is only valid for ages 41 to 50    Assessment/Plan     ENCOUNTER DIAGNOSES     ICD-10-CM   1. ADHD (attention deficit hyperactivity disorder)  F90.9   2. Mood disorder (CMS HCC)  F39     ADHD   -chronic, well controlled  -continue methylphenidate CD 50  mg daily    Mood disorder   -well controlled   -continue Wellbutrin 150 mg daily    Health maintenance:  Declined flu and Tdap vaccines, may consider in the future.    Orders Placed This Encounter    methylphenidate CD 50 mg Oral Capsule, Multiphasic Rel.30-70 Biphasic Rel.30-70    methylphenidate CD 50 mg Oral Capsule, Multiphasic Rel.30-70 Biphasic Rel.30-70    methylphenidate CD 50 mg Oral Capsule, Multiphasic Rel.30-70 Biphasic Rel.30-70       The patient was given the opportunity to ask questions and those questions were answered to the patient's satisfaction. The patient was encouraged to call with any additional questions or concerns. Instructed patient to call back if symptoms worsen.   Discussed with patient effects and side effects of medications. Medication safety was discussed. A copy of the patient's medication list was printed and given to the patient. A good faith effort was made to reconcile  the patient's medications.   This note may have been partially generated using MModal Fluency Direct system and there may be some incorrect words, spellings, and punctuation that were not noted in checking the note before saving.     Follow up:  Return in about 3 months (around 09/07/2022) for adhd follow up.      [place], DO   06/08/2022, 07:25

## 2022-07-13 ENCOUNTER — Other Ambulatory Visit (HOSPITAL_BASED_OUTPATIENT_CLINIC_OR_DEPARTMENT_OTHER): Payer: Self-pay | Admitting: Student in an Organized Health Care Education/Training Program

## 2022-07-13 DIAGNOSIS — F909 Attention-deficit hyperactivity disorder, unspecified type: Secondary | ICD-10-CM

## 2022-07-13 MED ORDER — METHYLPHENIDATE CD 50 MG BIPHASIC 30-70 CAPSULE,EXTENDED RELEASE
50.0000 mg | ORAL_CAPSULE | Freq: Every day | ORAL | 0 refills | Status: DC
Start: 2022-08-07 — End: 2024-01-24

## 2022-07-13 NOTE — Telephone Encounter (Signed)
Refill Request: [facility]    Patient requests refill of methylphenidate.    Last visit at [facility]:  06/08/2022  2.   Last My Telemed/ My Chart Visit: Visit date not found     3.  Next pending visit [facility]: 09/07/2022  4.  Was last visit within the past year: YES  5.  If not seen within the last year was appointment scheduled: NA    6.  [facility]ed [facility]: [facility]    Medication pended to provider for approval.    Durene Cal, Ambulatory Care Assistant 07/13/2022, 13:18

## 2022-09-07 ENCOUNTER — Ambulatory Visit (HOSPITAL_BASED_OUTPATIENT_CLINIC_OR_DEPARTMENT_OTHER): Payer: Self-pay | Admitting: Student in an Organized Health Care Education/Training Program

## 2022-09-10 ENCOUNTER — Other Ambulatory Visit: Payer: Self-pay

## 2022-09-10 ENCOUNTER — Ambulatory Visit
Payer: BC Managed Care – PPO | Attending: Student in an Organized Health Care Education/Training Program | Admitting: Student in an Organized Health Care Education/Training Program

## 2022-09-10 ENCOUNTER — Encounter (HOSPITAL_BASED_OUTPATIENT_CLINIC_OR_DEPARTMENT_OTHER): Payer: Self-pay | Admitting: Student in an Organized Health Care Education/Training Program

## 2022-09-10 VITALS — BP 128/82 | HR 98 | Temp 98.0°F | Ht 68.0 in | Wt 180.3 lb

## 2022-09-10 DIAGNOSIS — F909 Attention-deficit hyperactivity disorder, unspecified type: Secondary | ICD-10-CM | POA: Insufficient documentation

## 2022-09-10 MED ORDER — METHYLPHENIDATE CD 50 MG BIPHASIC 30-70 CAPSULE,EXTENDED RELEASE
50.0000 mg | ORAL_CAPSULE | Freq: Every day | ORAL | 0 refills | Status: AC
Start: 2022-09-22 — End: 2022-10-22

## 2022-09-10 MED ORDER — METHYLPHENIDATE CD 50 MG BIPHASIC 30-70 CAPSULE,EXTENDED RELEASE
50.0000 mg | ORAL_CAPSULE | Freq: Every day | ORAL | 0 refills | Status: AC
Start: 2022-10-23 — End: 2022-11-22

## 2022-09-10 MED ORDER — METHYLPHENIDATE CD 50 MG BIPHASIC 30-70 CAPSULE,EXTENDED RELEASE
50.0000 mg | ORAL_CAPSULE | Freq: Every day | ORAL | 0 refills | Status: AC
Start: 2022-11-20 — End: 2022-12-20

## 2022-09-10 NOTE — Progress Notes (Signed)
CLINIC PROGRESS NOTE  FAMILY MEDICINE, [facility]  [address] [place] [address]    Bradley Anderson  [birth year]  [id number]    Date of Service: 09/10/2022 11:40 AM EST    Chief complaint:   Chief Complaint   Patient presents with    Medication follow up       Subjective:   Bradley Anderson is a 21 y.o. male patient who presents for ADHD follow up. Currently taking methylphenidate 50mg  daily. Tolerating well, no current concerns. No changes to sleep or appetite on the medication.     REVIEW OF SYSTEMS:  ROS as above.    PHQ Questionnaire  Little interest or pleasure in doing things.: Not at all  Feeling down, depressed, or hopeless: Not at all  PHQ 2 Total: 0                       Health Maintenance Due   Topic Date Due    Adult Tdap-Td (7 - Td or Tdap) 05/27/2021    Influenza Vaccine (1) Never done    Covid-19 Vaccine (3 - 2023-24 season) 05/22/2022       Current Outpatient Medications   Medication Sig    buPROPion (WELLBUTRIN XL) 150 mg extended release 24 hr tablet Take 1 Tablet (150 mg total) by mouth Once a day    methylphenidate CD 50 mg Oral Capsule, Multiphasic Rel.30-70 Biphasic Rel.30-70 Take 1 Capsule (50 mg total) by mouth Once a day for 30 days    [START ON 09/22/2022] methylphenidate CD 50 mg Oral Capsule, Multiphasic Rel.30-70 Biphasic Rel.30-70 Take 1 Capsule (50 mg total) by mouth Once a day for 30 days    [START ON 10/23/2022] methylphenidate CD 50 mg Oral Capsule, Multiphasic Rel.30-70 Biphasic Rel.30-70 Take 1 Capsule (50 mg total) by mouth Once a day for 30 days    [START ON 11/20/2022] methylphenidate CD 50 mg Oral Capsule, Multiphasic Rel.30-70 Biphasic Rel.30-70 Take 1 Capsule (50 mg total) by mouth Once a day for 30 days    ofloxacin (OCUFLOX) 0.3 % Ophthalmic Drops Administer 1 Drop into the right eye Three times a day (Patient not taking: Reported on 09/10/2022)    prednisoLONE acetate (PRED FORTE) 1 % Ophthalmic Drops, Suspension Administer 1 Drop into the right eye Once a day     timoloL maleate (TIMOPTIC) 0.5 % Ophthalmic Drops Instill 1 Drop into both eyes Once a day (Patient not taking: Reported on 09/10/2022)       Objective:   BP 128/82   Pulse 98   Temp 36.7 C (98 F) (Temporal)   Ht 1.727 m (5\' 8" )   Wt 81.8 kg (180 lb 5.4 oz)   SpO2 99%   BMI 27.42 kg/m     General appearance: alert, no acute distress  Lungs: clear to auscultation bilaterally, no wheezes or crackles  Heart: RRR, no murmur appreciated  Abdomen: soft, non-tender, non-distended, BS+, no guarding  Extremities: atraumatic, no edema  Neuro: grossly normal, gait steady    Assessment/Plan     ENCOUNTER DIAGNOSES     ICD-10-CM   1. ADHD (attention deficit hyperactivity disorder)  F90.9   PDMP reviewed and appropriate. Continue methylphenidate 50mg  daily. Refills sent to pharmacy, 3 month supply. Follow up in 3 months.       Orders Placed This Encounter    methylphenidate CD 50 mg Oral Capsule, Multiphasic Rel.30-70 Biphasic Rel.30-70    methylphenidate CD 50 mg Oral Capsule, Multiphasic Rel.30-70 Biphasic  Rel.30-70    methylphenidate CD 50 mg Oral Capsule, Multiphasic Rel.30-70 Biphasic Rel.30-70     The patient was given the opportunity to ask questions and those questions were answered to the patient's satisfaction. The patient was encouraged to call with any additional questions or concerns. Instructed patient to call back if symptoms worsen.   Discussed with patient effects and side effects of medications. Medication safety was discussed. A copy of the patient's medication list was printed and given to the patient. A good faith effort was made to reconcile the patient's medications.   This note may have been partially generated using MModal Fluency Direct system and there may be some incorrect words, spellings, and punctuation that were not noted in checking the note before saving.     Follow up:  Return in about 3 months (around 12/10/2022) for adhd follow up.      Cortnie Ringel, DO   09/10/2022, 11:32

## 2022-10-07 ENCOUNTER — Other Ambulatory Visit (HOSPITAL_BASED_OUTPATIENT_CLINIC_OR_DEPARTMENT_OTHER): Payer: Self-pay | Admitting: Student in an Organized Health Care Education/Training Program

## 2022-10-07 DIAGNOSIS — F39 Unspecified mood [affective] disorder: Secondary | ICD-10-CM

## 2022-10-07 MED ORDER — BUPROPION HCL XL 150 MG 24 HR TABLET, EXTENDED RELEASE
150.0000 mg | ORAL_TABLET | Freq: Every day | ORAL | 3 refills | Status: DC
Start: 2022-10-07 — End: 2023-01-22

## 2022-10-07 NOTE — Nursing Note (Signed)
Refill Request: [facility]  Patient requests refill of bupropion.    1.  Last visit at [facility]:  09/10/2022  2.  Last My Telemed/ My Chart Visit: Visit date not found   3.  Next pending visit [facility]: 12/10/2022  4.  Was last visit within the past year: yes  5.  If not seen within the last year was appointment scheduled: yes  6.  [facility]    Medication pended to provider for approval.    Donnal Moat, CMA 10/07/2022, 16:01

## 2022-10-30 ENCOUNTER — Other Ambulatory Visit (HOSPITAL_BASED_OUTPATIENT_CLINIC_OR_DEPARTMENT_OTHER): Payer: Self-pay | Admitting: Student in an Organized Health Care Education/Training Program

## 2022-12-05 ENCOUNTER — Other Ambulatory Visit (HOSPITAL_BASED_OUTPATIENT_CLINIC_OR_DEPARTMENT_OTHER): Payer: Self-pay | Admitting: Student in an Organized Health Care Education/Training Program

## 2022-12-07 ENCOUNTER — Telehealth (HOSPITAL_BASED_OUTPATIENT_CLINIC_OR_DEPARTMENT_OTHER): Payer: Self-pay | Admitting: Student in an Organized Health Care Education/Training Program

## 2022-12-10 ENCOUNTER — Ambulatory Visit (HOSPITAL_BASED_OUTPATIENT_CLINIC_OR_DEPARTMENT_OTHER): Payer: BC Managed Care – PPO | Admitting: Student in an Organized Health Care Education/Training Program

## 2023-01-02 ENCOUNTER — Other Ambulatory Visit (HOSPITAL_BASED_OUTPATIENT_CLINIC_OR_DEPARTMENT_OTHER): Payer: Self-pay | Admitting: Student in an Organized Health Care Education/Training Program

## 2023-01-02 DIAGNOSIS — F39 Unspecified mood [affective] disorder: Secondary | ICD-10-CM

## 2023-01-04 ENCOUNTER — Telehealth (HOSPITAL_BASED_OUTPATIENT_CLINIC_OR_DEPARTMENT_OTHER): Payer: Self-pay | Admitting: Student in an Organized Health Care Education/Training Program

## 2023-01-22 ENCOUNTER — Other Ambulatory Visit (HOSPITAL_BASED_OUTPATIENT_CLINIC_OR_DEPARTMENT_OTHER): Payer: Self-pay | Admitting: Family

## 2023-01-22 ENCOUNTER — Other Ambulatory Visit (HOSPITAL_BASED_OUTPATIENT_CLINIC_OR_DEPARTMENT_OTHER): Payer: Self-pay | Admitting: Student in an Organized Health Care Education/Training Program

## 2023-01-22 DIAGNOSIS — F39 Unspecified mood [affective] disorder: Secondary | ICD-10-CM

## 2023-01-22 DIAGNOSIS — Z947 Corneal transplant status: Secondary | ICD-10-CM

## 2023-01-25 MED ORDER — BUPROPION HCL XL 150 MG 24 HR TABLET, EXTENDED RELEASE
150.0000 mg | ORAL_TABLET | Freq: Every day | ORAL | 3 refills | Status: DC
Start: 2023-01-25 — End: 2023-07-29

## 2023-01-25 MED ORDER — PREDNISOLONE ACETATE 1 % EYE DROPS,SUSPENSION
1.0000 [drp] | Freq: Every day | OPHTHALMIC | 11 refills | Status: DC
Start: 2023-01-25 — End: 2023-12-09

## 2023-01-25 NOTE — Telephone Encounter (Signed)
Refill Request: [facility]   Patient requests refill of prednisolone.    1.  Last visit at [facility]:  09/10/2022  2.  Last My Telemed/ My Chart Visit: Visit date not found   3.  Next pending visit [facility]: 02/11/2023  4.  Was last visit within the past year: yes   5.  If not seen within the last year was appointment scheduled: N/A  6.  [facility]    Medication pended to provider for approval.    Yvonne Kendall, MA 01/25/2023, 09:42

## 2023-01-25 NOTE — Telephone Encounter (Signed)
[facility]   Family Medicine Dept.     Refill Request:  Patient requests refill of: Wellbutrin XL 150mg .    Patient's Last Visit With [facility]: 09/10/2022    Patient's Last My Telemed/ My Chart Visit: Visit date not found     Patient's Next Visit At [facility] (Family Medicine) : 02/11/2023    Has The Patient Been Seen Within The Last Year: Yes    Is There An Upcoming Appointment Scheduled: Yes    [facility]ed [facility] Medication[facility] [address]     Last Order Date of Medication: 10/07/22    Additional Notes:   Original script was sent into [facility] pharmacy. [facility]     Medication refill request have been pended for appropriate provider approval.     Brunetta Jeans, Ambulatory Care Assistant 01/25/2023, 09:49

## 2023-02-11 ENCOUNTER — Other Ambulatory Visit: Payer: Self-pay

## 2023-02-11 ENCOUNTER — Ambulatory Visit
Payer: BC Managed Care – PPO | Attending: Student in an Organized Health Care Education/Training Program | Admitting: Student in an Organized Health Care Education/Training Program

## 2023-02-11 ENCOUNTER — Encounter (HOSPITAL_BASED_OUTPATIENT_CLINIC_OR_DEPARTMENT_OTHER): Payer: Self-pay | Admitting: Student in an Organized Health Care Education/Training Program

## 2023-02-11 ENCOUNTER — Ambulatory Visit (HOSPITAL_BASED_OUTPATIENT_CLINIC_OR_DEPARTMENT_OTHER): Payer: BC Managed Care – PPO | Admitting: Student in an Organized Health Care Education/Training Program

## 2023-02-11 VITALS — BP 122/78 | HR 75 | Temp 98.0°F | Ht 70.0 in | Wt 177.9 lb

## 2023-02-11 DIAGNOSIS — F909 Attention-deficit hyperactivity disorder, unspecified type: Secondary | ICD-10-CM | POA: Insufficient documentation

## 2023-02-11 MED ORDER — METHYLPHENIDATE CD 50 MG BIPHASIC 30-70 CAPSULE,EXTENDED RELEASE
50.0000 mg | ORAL_CAPSULE | Freq: Every day | ORAL | 0 refills | Status: AC
Start: 2023-04-13 — End: 2023-05-13

## 2023-02-11 MED ORDER — METHYLPHENIDATE CD 50 MG BIPHASIC 30-70 CAPSULE,EXTENDED RELEASE
50.0000 mg | ORAL_CAPSULE | Freq: Every day | ORAL | 0 refills | Status: AC
Start: 2023-03-14 — End: 2023-04-13

## 2023-02-11 MED ORDER — METHYLPHENIDATE CD 50 MG BIPHASIC 30-70 CAPSULE,EXTENDED RELEASE
50.0000 mg | ORAL_CAPSULE | Freq: Every day | ORAL | 0 refills | Status: AC
Start: 2023-02-11 — End: 2023-03-13

## 2023-02-11 NOTE — Progress Notes (Signed)
CLINIC PROGRESS NOTE  FAMILY MEDICINE, [facility]  [address] [place] [address]    Bradley Anderson  10-01-2000  [id number]    Date of Service: 02/11/2023 11:20 AM EDT    Chief complaint:   Chief Complaint   Patient presents with    Medication follow up       Subjective:   Bradley Anderson is a 22 y.o. male patient who presents for ADHD follow-up.  Has been taking methylphenidate 50 mg daily.  He has ran out of the medication here recently but overall tolerates the medication well without side effects.  No sleep or appetite changes on the medication.  No questions or concerns today.  Declines doing his annual wellness today but complained to do it in the future.    REVIEW OF SYSTEMS:  ROS as above.        Health Maintenance Due   Topic Date Due    Hepatitis C screening  Never done    Adult Tdap-Td (7 - Td or Tdap) 05/27/2021    NonMedicare Preventative Exam  03/27/2022    Covid-19 Vaccine (3 - 2023-24 season) 05/22/2022       Current Outpatient Medications   Medication Sig    buPROPion (WELLBUTRIN XL) 150 mg extended release 24 hr tablet Take 1 Tablet (150 mg total) by mouth Once a day    methylphenidate CD 50 mg Oral Capsule, Multiphasic Rel.30-70 Biphasic Rel.30-70 Take 1 Capsule (50 mg total) by mouth Once a day for 30 days    [START ON 03/14/2023] methylphenidate CD 50 mg Oral Capsule, Multiphasic Rel.30-70 Biphasic Rel.30-70 Take 1 Capsule (50 mg total) by mouth Once a day for 30 days    [START ON 04/13/2023] methylphenidate CD 50 mg Oral Capsule, Multiphasic Rel.30-70 Biphasic Rel.30-70 Take 1 Capsule (50 mg total) by mouth Once a day for 30 days    prednisoLONE acetate (PRED FORTE) 1 % Ophthalmic Drops, Suspension Administer 1 Drop into the right eye Once a day       Objective:   BP 122/78   Pulse 75   Temp 36.7 C (98 F) (Thermal Scan)   Ht 1.778 m (5\' 10" )   Wt 80.7 kg (177 lb 14.6 oz)   SpO2 98%   BMI 25.53 kg/m       General appearance: alert, no acute distress  Lungs: clear to  auscultation bilaterally, no wheezes or crackles  Heart: RRR, no murmur appreciated  Abdomen: soft, non-tender, non-distended, BS+, no guarding  Extremities: atraumatic, no edema  Neuro: grossly normal, gait steady  Assessment/Plan     ENCOUNTER DIAGNOSES     ICD-10-CM   1. ADHD (attention deficit hyperactivity disorder)  F90.9   PDMP reviewed and appropriate.  Refills ordered.  Follow-up in 3 months.    Orders Placed This Encounter    methylphenidate CD 50 mg Oral Capsule, Multiphasic Rel.30-70 Biphasic Rel.30-70    methylphenidate CD 50 mg Oral Capsule, Multiphasic Rel.30-70 Biphasic Rel.30-70    methylphenidate CD 50 mg Oral Capsule, Multiphasic Rel.30-70 Biphasic Rel.30-70         The patient was given the opportunity to ask questions and those questions were answered to the patient's satisfaction. The patient was encouraged to call with any additional questions or concerns. Instructed patient to call back if symptoms worsen.   Discussed with patient effects and side effects of medications. Medication safety was discussed. A copy of the patient's medication list was printed and given to the patient. A good  faith effort was made to reconcile the patient's medications.   This note may have been partially generated using MModal Fluency Direct system and there may be some incorrect words, spellings, and punctuation that were not noted in checking the note before saving.     Follow up:  Return in about 3 months (around 05/14/2023) for 40 minute, annual exam, adhd follow up.      Corlis Angelica, DO   02/11/2023, 11:13

## 2023-03-22 ENCOUNTER — Other Ambulatory Visit (HOSPITAL_BASED_OUTPATIENT_CLINIC_OR_DEPARTMENT_OTHER): Payer: Self-pay | Admitting: Student in an Organized Health Care Education/Training Program

## 2023-03-23 ENCOUNTER — Telehealth (HOSPITAL_BASED_OUTPATIENT_CLINIC_OR_DEPARTMENT_OTHER): Payer: Self-pay | Admitting: Student in an Organized Health Care Education/Training Program

## 2023-05-20 ENCOUNTER — Other Ambulatory Visit: Payer: Self-pay

## 2023-05-20 ENCOUNTER — Encounter (HOSPITAL_BASED_OUTPATIENT_CLINIC_OR_DEPARTMENT_OTHER): Payer: Self-pay | Admitting: Student in an Organized Health Care Education/Training Program

## 2023-05-20 ENCOUNTER — Ambulatory Visit
Payer: BC Managed Care – PPO | Attending: Student in an Organized Health Care Education/Training Program | Admitting: Student in an Organized Health Care Education/Training Program

## 2023-05-20 VITALS — BP 120/76 | HR 85 | Temp 97.8°F | Ht 69.0 in | Wt 176.8 lb

## 2023-05-20 DIAGNOSIS — Z79899 Other long term (current) drug therapy: Secondary | ICD-10-CM | POA: Insufficient documentation

## 2023-05-20 DIAGNOSIS — F909 Attention-deficit hyperactivity disorder, unspecified type: Secondary | ICD-10-CM | POA: Insufficient documentation

## 2023-05-20 DIAGNOSIS — F39 Unspecified mood [affective] disorder: Secondary | ICD-10-CM | POA: Insufficient documentation

## 2023-05-20 DIAGNOSIS — Z0000 Encounter for general adult medical examination without abnormal findings: Secondary | ICD-10-CM | POA: Insufficient documentation

## 2023-05-20 MED ORDER — METHYLPHENIDATE CD 50 MG BIPHASIC 30-70 CAPSULE,EXTENDED RELEASE
50.0000 mg | ORAL_CAPSULE | Freq: Every day | ORAL | 0 refills | Status: AC
Start: 2023-05-20 — End: 2023-06-19

## 2023-05-20 MED ORDER — METHYLPHENIDATE CD 50 MG BIPHASIC 30-70 CAPSULE,EXTENDED RELEASE
50.0000 mg | ORAL_CAPSULE | Freq: Every day | ORAL | 0 refills | Status: AC
Start: 2023-06-18 — End: 2023-07-18

## 2023-05-20 MED ORDER — METHYLPHENIDATE CD 50 MG BIPHASIC 30-70 CAPSULE,EXTENDED RELEASE
50.0000 mg | ORAL_CAPSULE | Freq: Every day | ORAL | 0 refills | Status: AC
Start: 2023-07-20 — End: 2023-08-19

## 2023-05-20 NOTE — Progress Notes (Signed)
CLINIC PROGRESS NOTE  FAMILY MEDICINE, [facility]  [address] [place] [address]    [name]  [birth year]  [id number]    Date of Service: 05/20/2023 11:20 AM EDT    Chief complaint:   Chief Complaint   Patient presents with    Annual Wellness Exam     Subjective:   Bradley Anderson is a 22 y.o. male patient who presents for annual wellness exam and ADHD follow up.  Continues to take methylphenidate 50 mg daily.  Tolerating the medication well and feels that it continues to work well for his symptoms.  No sleep or appetite changes on the medication.  No unexplained weight loss.  For mood disorder, also tolerating Wellbutrin well.  Feels that this medication is doing well and would like to continue.  No questions or concerns today.    REVIEW OF SYSTEMS:  ROS as above.     reports that he has never smoked. He has never used smokeless tobacco. He reports that he does not drink alcohol and does not use drugs.  - Occupation: [employer]   -Alcohol Misuse: no  -Diet History: "healthy" diet  in general  well balanced  -Exercise History: 5 days per week  -Sleep: 8 hours a night.   -STI Screening-Any known exposure/risk to STI's:  no  -Routine Eye and Dentist Visits :yes         There are no preventive care reminders to display for this patient.      Current Outpatient Medications   Medication Sig    buPROPion (WELLBUTRIN XL) 150 mg extended release 24 hr tablet Take 1 Tablet (150 mg total) by mouth Once a day    methylphenidate CD 50 mg Oral Capsule, Multiphasic Rel.30-70 Biphasic Rel.30-70 Take 1 Capsule (50 mg total) by mouth Once a day for 30 days    [START ON 06/18/2023] methylphenidate CD 50 mg Oral Capsule, Multiphasic Rel.30-70 Biphasic Rel.30-70 Take 1 Capsule (50 mg total) by mouth Once a day for 30 days    [START ON 07/20/2023] methylphenidate CD 50 mg Oral Capsule, Multiphasic Rel.30-70 Biphasic Rel.30-70 Take 1 Capsule (50 mg total) by mouth Once a day for 30 days    prednisoLONE acetate (PRED  FORTE) 1 % Ophthalmic Drops, Suspension Administer 1 Drop into the right eye Once a day       Objective:   BP 120/76   Pulse 85   Temp 36.6 C (97.8 F) (Thermal Scan)   Ht 1.753 m (5\' 9" )   Wt 80.2 kg (176 lb 12.9 oz)   SpO2 97%   BMI 26.11 kg/m     General appearance: alert, no acute distress  HEENT: tympanic membranes non erythematous, non-bulging. No lymphadenopathy. Oral and nasal mucosa normal   Lungs: clear to auscultation bilaterally, no wheezes or crackles  Heart: RRR, no murmur appreciated  Abdomen: soft, non-tender, non-distended, BS+, no guarding  Extremities: atraumatic, no edema  Neuro: grossly normal, gait steady    Diabetes Monitors  A1C - Glucose - Lipids Microalbumin   No results for input(s): "HA1C", "POCTHA1C", "GLUCOSEFAST", "CHOLESTEROL", "HDLCHOL", "LDLCHOL", "LDLCHOLDIR", "TRIG" in the last 41660 hours. No results for input(s): "MICALBRNUR", "MICALBCRERAT" in the last 63016 hours.     Retinal Exam Date: Not Found DM Retinopathy - Negative Date  Last Foot Exam: Not Found    The ASCVD Risk score (Arnett DK, et al., 2019) failed to calculate for the following reasons:    The 2019 ASCVD risk score is only valid  for ages 57 to 26    Assessment/Plan     ENCOUNTER DIAGNOSES     ICD-10-CM   1. Encounter for preventative adult health care examination  Z00.00   2. ADHD (attention deficit hyperactivity disorder)  F90.9   3. Mood disorder (CMS HCC)  F39     ADHD   -chronic, well controlled   -PDMP reviewed and appropriate   -continue methylphenidate CD 50 mg daily, refills ordered    Mood disorder   -chronic, well controlled   -continue Wellbutrin 150 mg daily    -Colorectal cancer: Patient greater and 75 yrs and wishes to discontinue screening at this time.  -Lung Cancer >50 yr age screen if 20 pack yr current or former quit in past 15 yrs  N\A  -Osteoporosis (52 and older or postmenopausal <65 with risk factor): N/a  -Vit D testing (if community dwelling or high fall risk)No.    Recommended  screening guidelines discussed with patient and shared decision making completed.   - Age appropriate screening completed/ordered today.   -Patient counseled today about the following preventive/treatment guideline for her age as above: diet,exercise, health body weight, and alcohol use recommendations.   -Recommend a minimum of thirty minutes of moderate exercise (ie walking at a brisk pace, bicycling, etc.) 4-5 days per week to prevent weight gain from age-related loss of metabolism.  Recommend increasing the intensity (ie running, jogging, and/or resistance training) and/or the duration of time spent exercising to facilitate weight loss and improve cardiac and respiratory fitness.  - reviewed recommended vaccinations, patient at this time.     Health Maintenance: Pending and Last Completed         Date Due Completion Date    Covid-19 Vaccine (3 - 2023-24 season) 08/18/2023 (Originally 05/22/2022) 05/15/2020    Adult Tdap-Td (7 - Td or Tdap) 05/19/2024 (Originally 05/27/2021) 05/28/2011    Hepatitis C screening 05/19/2024 (Originally Apr 16, 2001) ---    Influenza Vaccine (1) 05/23/2023 ---    Depression Screening 09/11/2023 09/10/2022    NonMedicare Preventative Exam 05/19/2024 05/20/2023          Orders Placed This Encounter    methylphenidate CD 50 mg Oral Capsule, Multiphasic Rel.30-70 Biphasic Rel.30-70    methylphenidate CD 50 mg Oral Capsule, Multiphasic Rel.30-70 Biphasic Rel.30-70    methylphenidate CD 50 mg Oral Capsule, Multiphasic Rel.30-70 Biphasic Rel.30-70     The patient was given the opportunity to ask questions and those questions were answered to the patient's satisfaction. The patient was encouraged to call with any additional questions or concerns. Instructed patient to call back if symptoms worsen.   Discussed with patient effects and side effects of medications. Medication safety was discussed. A copy of the patient's medication list was printed and given to the patient. A good faith effort was made to  reconcile the patient's medications.   This note may have been partially generated using MModal Fluency Direct system and there may be some incorrect words, spellings, and punctuation that were not noted in checking the note before saving.     Follow up:  Return in about 3 months (around 08/20/2023) for adhd follow up.    Kendrah Lovern, DO   05/20/2023, 10:58

## 2023-06-05 ENCOUNTER — Other Ambulatory Visit (HOSPITAL_BASED_OUTPATIENT_CLINIC_OR_DEPARTMENT_OTHER): Payer: Self-pay | Admitting: Lactation Consultant

## 2023-06-05 DIAGNOSIS — F39 Unspecified mood [affective] disorder: Secondary | ICD-10-CM

## 2023-06-08 NOTE — Telephone Encounter (Signed)
Patient should have refill available at pharmacy. Medication last sent in on 01/25/23 90 tablets with 3 refills.

## 2023-07-12 ENCOUNTER — Telehealth (HOSPITAL_BASED_OUTPATIENT_CLINIC_OR_DEPARTMENT_OTHER): Payer: Self-pay | Admitting: Student in an Organized Health Care Education/Training Program

## 2023-07-12 NOTE — Telephone Encounter (Signed)
Called to talk about refills, he said they was getting sent to the wrong pharmacy, i let him know that [facility] to [facility].     Dionne Milo, Ambulatory Care Assistant

## 2023-07-28 ENCOUNTER — Encounter (HOSPITAL_BASED_OUTPATIENT_CLINIC_OR_DEPARTMENT_OTHER): Payer: Self-pay | Admitting: Student in an Organized Health Care Education/Training Program

## 2023-07-28 DIAGNOSIS — F39 Unspecified mood [affective] disorder: Secondary | ICD-10-CM

## 2023-07-29 MED ORDER — BUPROPION HCL XL 150 MG 24 HR TABLET, EXTENDED RELEASE
150.0000 mg | ORAL_TABLET | Freq: Every day | ORAL | 3 refills | Status: DC
Start: 2023-07-29 — End: 2024-04-12

## 2023-07-29 NOTE — Telephone Encounter (Signed)
[facility] at [phone] - [facility] the [facility] was [facility] to [facility]. They have the prescription on file.     I called the patient the patient states its at the wrong pharmacy.    Patient asking for this to go to [facility] - pended for your approval.    Rx was sent to pharmacy on 01/25/23 for 90 day supply with 3 refills.

## 2023-08-02 ENCOUNTER — Ambulatory Visit (HOSPITAL_BASED_OUTPATIENT_CLINIC_OR_DEPARTMENT_OTHER): Payer: Self-pay | Admitting: Student in an Organized Health Care Education/Training Program

## 2023-08-02 NOTE — Telephone Encounter (Signed)
Medication sent in another encounter.

## 2023-08-02 NOTE — Telephone Encounter (Signed)
Regarding: Refill Request  ----- Message from Autumn G sent at 07/26/2023  2:56 PM EST -----  Copied From CRM [id number].Anderson, Bradley called to request a prescription refill. [facility]; Pt has not been able to get his refills and asks for a new script to be called into [facility] below.     Current Outpatient Medications:  --buPROPion (WELLBUTRIN XL) 150 mg extended release 24 hr tablet, Take 1 Tablet (150 mg total) by mouth Once a day    Preferred Pharmacy     [facility] [address], [place] - [address]    [address] [place] [address]    Phone: [phone] Fax: [phone]    Hours: Not open 24 hours

## 2023-08-26 ENCOUNTER — Ambulatory Visit
Payer: BC Managed Care – PPO | Attending: Student in an Organized Health Care Education/Training Program | Admitting: Student in an Organized Health Care Education/Training Program

## 2023-08-26 ENCOUNTER — Telehealth (HOSPITAL_BASED_OUTPATIENT_CLINIC_OR_DEPARTMENT_OTHER): Payer: Self-pay | Admitting: Student in an Organized Health Care Education/Training Program

## 2023-08-26 DIAGNOSIS — F909 Attention-deficit hyperactivity disorder, unspecified type: Secondary | ICD-10-CM

## 2023-08-26 MED ORDER — METHYLPHENIDATE CD 50 MG BIPHASIC 30-70 CAPSULE,EXTENDED RELEASE
50.0000 mg | ORAL_CAPSULE | Freq: Every day | ORAL | 0 refills | Status: AC
Start: 2023-09-26 — End: 2023-10-26

## 2023-08-26 MED ORDER — METHYLPHENIDATE CD 50 MG BIPHASIC 30-70 CAPSULE,EXTENDED RELEASE
50.0000 mg | ORAL_CAPSULE | Freq: Every day | ORAL | 0 refills | Status: AC
Start: 2023-10-27 — End: 2023-11-26

## 2023-08-26 MED ORDER — METHYLPHENIDATE CD 50 MG BIPHASIC 30-70 CAPSULE,EXTENDED RELEASE
50.0000 mg | ORAL_CAPSULE | Freq: Every day | ORAL | 0 refills | Status: AC
Start: 2023-08-26 — End: 2023-09-25

## 2023-08-26 NOTE — Progress Notes (Signed)
FAMILY MEDICINE, [facility]  [address]  [place] [address]  Operated by [facility]  Video Visit     Name:  Bradley Anderson MRN: [id number]   Date:    08/26/2023 Age:  22 y.o.                                      Patient's location: Home - [place] [address]   Patient/family aware of provider location: Yes  Patient/family consent for video visit: Yes  Interview and observation performed by: Chrisandra Netters, DO    Chief Complaint: ADHD      History of Present Illness:  Bradley Anderson is a 22 y.o. male seen today via video visit for ADHD follow up.   Tolerating methylphenidate CD 50mg  well without side effects. Denies sleep or appetite changes on the medication, no unexplained weight loss. Feels that medicine continues to work well. No other questions or concerns today.       Past Medical History:  Past Medical History:   Diagnosis Date    ADHD (attention deficit hyperactivity disorder)     Allergic rhinitis     Mood disorder (CMS HCC) 03/27/2021    Phobia     Pt does pass out with any injection or blood draw         Past Surgical History:   Procedure Laterality Date    HX CORNEAL TRANSPLANT Right 2020    HX TONSILLECTOMY      HX WISDOM TEETH EXTRACTION            Family Medical History:       Problem Relation (Age of Onset)    ADHD/ADD Father    Bipolar Disorder Father    Depression Mother    Diabetes Father    Healthy Half-Brother    Hypertension (High Blood Pressure) Father             Social History     Tobacco Use    Smoking status: Never    Smokeless tobacco: Never   Substance Use Topics    Alcohol use: Never    Drug use: Never      Allergies   Allergen Reactions    Grass Pollen-Johnson     Mold         Medications:    buPROPion    methylphenidate CD    [START ON 09/26/2023] methylphenidate CD    [START ON 10/27/2023] methylphenidate CD    prednisoLONE acetate     Review of Systems:  General: Negative for fever.  Eyes: Negative for conjunctivitis.   HENT: Negative for headaches.   Cardiac:  Negative for chest pain.   Pulmonary: Negative for shortness of breath or cough   GI: Negative for abdominal pain, vomiting, or diarrhea.   Skin: Negative for rashes.   Heme: Negative for bruising or bleeding.   Musculoskeletal: Negative for new myalgias or arthralgias.  Neuro: Negative for weakness or headaches.     Observational Exam:   No vitals taken for this visit due to telemedicine encounter.   General: Well appearing, in no acute distress.  Neuro: Alert and oriented.  Psych: Pleasant and cooperative. Normal affect.     Assessment/Plan:    ICD-10-CM    1. ADHD (attention deficit hyperactivity disorder)  F90.9         PDMP reviewed and appropriate.     Orders Placed This  Encounter    methylphenidate CD 50 mg Oral Capsule, Multiphasic Rel.30-70 Biphasic Rel.30-70    methylphenidate CD 50 mg Oral Capsule, Multiphasic Rel.30-70 Biphasic Rel.30-70    methylphenidate CD 50 mg Oral Capsule, Multiphasic Rel.30-70 Biphasic Rel.30-70       Follow Up:  Return in about 3 months (around 11/24/2023) for adhd follow up.     Nai Borromeo, DO

## 2023-12-09 ENCOUNTER — Other Ambulatory Visit (HOSPITAL_BASED_OUTPATIENT_CLINIC_OR_DEPARTMENT_OTHER): Payer: Self-pay | Admitting: Lactation Consultant

## 2023-12-09 DIAGNOSIS — Z947 Corneal transplant status: Secondary | ICD-10-CM

## 2023-12-10 MED ORDER — PREDNISOLONE ACETATE 1 % EYE DROPS,SUSPENSION
1.0000 [drp] | Freq: Every day | OPHTHALMIC | 2 refills | Status: AC
Start: 2023-12-10 — End: ?

## 2023-12-10 NOTE — Telephone Encounter (Signed)
 [facility]   Family Medicine Dept.     Refill Request:  Patient requests refill of: Presdiolone 1% Ophthalmic Drops.    Patient's Last Visit With [facility]: 05/20/2023    Patient's Last My Telemed/ My Chart Visit: 08/26/2023 Jeral Moll, DO     Patient's Next Visit At [facility]) : Visit date not found    Has The Patient Been Seen Within The Last Year: Yes    Is There An Upcoming Appointment Scheduled: No    [facility] [address]     Last Order Date of Medication: 01/25/23    Medication refill request have been pended for appropriate provider approval.   Delsie Figures, Ambulatory Care Assistant 12/10/2023, 07:33

## 2024-01-13 ENCOUNTER — Other Ambulatory Visit (HOSPITAL_BASED_OUTPATIENT_CLINIC_OR_DEPARTMENT_OTHER): Payer: Self-pay | Admitting: Family

## 2024-01-13 DIAGNOSIS — Z947 Corneal transplant status: Secondary | ICD-10-CM

## 2024-01-13 NOTE — Telephone Encounter (Signed)
 Pt needs a wellness LVM for pt

## 2024-01-17 ENCOUNTER — Telehealth (HOSPITAL_BASED_OUTPATIENT_CLINIC_OR_DEPARTMENT_OTHER): Payer: Self-pay | Admitting: Student in an Organized Health Care Education/Training Program

## 2024-01-17 ENCOUNTER — Other Ambulatory Visit (HOSPITAL_BASED_OUTPATIENT_CLINIC_OR_DEPARTMENT_OTHER): Payer: Self-pay | Admitting: Student in an Organized Health Care Education/Training Program

## 2024-01-17 NOTE — Telephone Encounter (Signed)
 Refill Request: [facility]  Patient requests refill of methylphenidate  CD 50 mg Oral Capsule .    1.  Last visit at [facility]:  05/20/2023  2.  Last My Telemed/ My Chart Visit: 08/26/2023 McCranie, Carly, DO   3.  Next pending visit [facility]: 01/20/2024  4.  Was last visit within the past year: Yes  5.  If not seen within the last year was appointment scheduled: N/A  6.  [facility]    Medication pended to provider for approval.    Myrl Askew, Ambulatory Care Assistant 01/17/2024, 10:34        Copied From CRM [id number].  Tomberlin, Marlene () called to request a prescription refill.      ---  methylphenidate  CD 50 mg Oral Capsule, Multiphasic Rel.30-70 Biphasic Rel.30-70 Take 1 Capsule (50 mg total) by mouth Once a day for 30 days    Preferred Pharmacy     [facility] [address], [place] - [address]   [address] [place] [address]   Phone: [phone] Fax: [phone]   Hours: Not open 24 hours

## 2024-01-20 ENCOUNTER — Ambulatory Visit: Admitting: Student in an Organized Health Care Education/Training Program

## 2024-01-24 ENCOUNTER — Ambulatory Visit
Payer: Self-pay | Attending: Student in an Organized Health Care Education/Training Program | Admitting: Student in an Organized Health Care Education/Training Program

## 2024-01-24 ENCOUNTER — Encounter (HOSPITAL_BASED_OUTPATIENT_CLINIC_OR_DEPARTMENT_OTHER): Payer: Self-pay | Admitting: Student in an Organized Health Care Education/Training Program

## 2024-01-24 ENCOUNTER — Other Ambulatory Visit: Payer: Self-pay

## 2024-01-24 VITALS — BP 118/84 | HR 94 | Temp 97.4°F | Ht 70.0 in | Wt 183.4 lb

## 2024-01-24 DIAGNOSIS — F909 Attention-deficit hyperactivity disorder, unspecified type: Secondary | ICD-10-CM | POA: Insufficient documentation

## 2024-01-24 MED ORDER — METHYLPHENIDATE CD 50 MG BIPHASIC 30-70 CAPSULE,EXTENDED RELEASE
50.0000 mg | ORAL_CAPSULE | Freq: Every day | ORAL | 0 refills | Status: DC
Start: 2024-01-24 — End: 2024-02-24

## 2024-01-24 NOTE — Progress Notes (Signed)
 CLINIC PROGRESS NOTE  [facility] MEDICINE, [facility]  [address] [place] [address]    Bradley Anderson  August 30, 2001  [id number]    Date of Service: 01/24/2024 11:00 AM EDT    Chief complaint:   Chief Complaint   Patient presents with    Medication Refill       Subjective:   Bradley Anderson is a 23 y.o. male patient who presents for ADHD follow-up.  Tolerating methylphenidate  50 mg and Wellbutrin  150 mg well without side effects.  Had run out of the medication because he did not have a follow-up appointment.  Denies sleep or appetite changes on the medication, no unexplained weight loss, chest pain, heart palpitations, shortness of breath. Feels that medicine continues to work well. No other questions or concerns today.     REVIEW OF SYSTEMS:  ROS as above.      Health Maintenance Due   Topic Date Due    Covid-19 Vaccine (3 - 2024-25 season) 05/23/2023    Depression Screening  09/11/2023       Current Outpatient Medications   Medication Sig    buPROPion  (WELLBUTRIN  XL) 150 mg extended release 24 hr tablet Take 1 Tablet (150 mg total) by mouth Once a day    methylphenidate  CD 50 mg Oral Capsule, Multiphasic Rel.30-70 Biphasic Rel.30-70 Take 1 Capsule (50 mg total) by mouth Daily for 30 days    prednisoLONE  acetate (PRED FORTE ) 1 % Ophthalmic Drops, Suspension Administer 1 Drop into the right eye Daily       Objective:   BP 118/84   Pulse 94   Temp 36.3 C (97.4 F) (Thermal Scan)   Ht 1.778 m (5\' 10" )   Wt 83.2 kg (183 lb 6.8 oz)   SpO2 97%   BMI 26.32 kg/m       General appearance: alert, no acute distress  Lungs: clear to auscultation bilaterally, no wheezes or crackles  Heart: RRR, no murmur appreciated  Abdomen: soft, non-tender, non-distended, BS+, no guarding  Extremities: atraumatic, no edema  Neuro: grossly normal, gait steady    Assessment/Plan     ENCOUNTER DIAGNOSES     ICD-10-CM   1. ADHD (attention deficit hyperactivity disorder)  F90.9   PDMP reviewed and appropriate.  Continue  methylphenidate  CD 50 mg daily, Wellbutrin  150 mg daily.  Thirty day supply of medication sent to pharmacy, patient to request refill each month.    Follow-up in 3 months.      Orders Placed This Encounter    Schedule Follow-Up    methylphenidate  CD 50 mg Oral Capsule, Multiphasic Rel.30-70 Biphasic Rel.30-70         The patient was given the opportunity to ask questions and those questions were answered to the patient's satisfaction. The patient was encouraged to call with any additional questions or concerns. Instructed patient to call back if symptoms worsen.   Discussed with patient effects and side effects of medications. Medication safety was discussed. A copy of the patient's medication list was printed and given to the patient. A good faith effort was made to reconcile the patient's medications.   This note may have been partially generated using MModal Fluency Direct system and there may be some incorrect words, spellings, and punctuation that were not noted in checking the note before saving.     Follow up on or around: 04/25/2024. Reason for Follow Up: ADHD follow up       Tawanna Funk, DO   01/24/2024, 10:42

## 2024-02-24 ENCOUNTER — Other Ambulatory Visit (HOSPITAL_BASED_OUTPATIENT_CLINIC_OR_DEPARTMENT_OTHER): Payer: Self-pay | Admitting: Student in an Organized Health Care Education/Training Program

## 2024-02-24 DIAGNOSIS — F909 Attention-deficit hyperactivity disorder, unspecified type: Secondary | ICD-10-CM

## 2024-02-24 NOTE — Telephone Encounter (Signed)
 Refill Request: [facility]  Patient requests refill of methylphenidate  CD 50 mg Oral Capsule .    1.  Last visit at [facility]:  01/24/2024  2.  Last My Telemed/ My Chart Visit: Visit date not found   3.  Next pending visit [facility]: 03/30/2024  4.  Was last visit within the past year: Yes  5.  If not seen within the last year was appointment scheduled: N/A  6.  [facility]: [facility]    Medication pended to provider for approval.    Myrl Askew, Ambulatory Care Assistant 02/24/2024, 15:55      Copied From CRM [id number].  Geise, Secundino () called to request a prescription refill.      [facility] [address] [place] - [address]    [address] [place] [address]    Phone: [phone] Fax: [phone]    Hours: Not open 24 hours      --methylphenidate  CD 50 mg Oral Capsule, Multiphasic Rel.30-70 Biphasic Rel.30-70 (Expired)

## 2024-02-25 MED ORDER — METHYLPHENIDATE CD 50 MG BIPHASIC 30-70 CAPSULE,EXTENDED RELEASE
50.0000 mg | ORAL_CAPSULE | Freq: Every day | ORAL | 0 refills | Status: DC
Start: 2024-02-25 — End: 2024-03-30

## 2024-03-29 ENCOUNTER — Other Ambulatory Visit: Payer: Self-pay

## 2024-03-30 ENCOUNTER — Telehealth (HOSPITAL_BASED_OUTPATIENT_CLINIC_OR_DEPARTMENT_OTHER): Payer: Self-pay | Admitting: Student in an Organized Health Care Education/Training Program

## 2024-03-30 ENCOUNTER — Ambulatory Visit (HOSPITAL_BASED_OUTPATIENT_CLINIC_OR_DEPARTMENT_OTHER): Admitting: Student in an Organized Health Care Education/Training Program

## 2024-03-30 ENCOUNTER — Other Ambulatory Visit (HOSPITAL_BASED_OUTPATIENT_CLINIC_OR_DEPARTMENT_OTHER): Payer: Self-pay | Admitting: Student in an Organized Health Care Education/Training Program

## 2024-03-30 DIAGNOSIS — F909 Attention-deficit hyperactivity disorder, unspecified type: Secondary | ICD-10-CM

## 2024-03-30 MED ORDER — METHYLPHENIDATE CD 50 MG BIPHASIC 30-70 CAPSULE,EXTENDED RELEASE
50.0000 mg | ORAL_CAPSULE | Freq: Every day | ORAL | 0 refills | Status: DC
Start: 2024-03-30 — End: 2024-04-25

## 2024-04-12 ENCOUNTER — Other Ambulatory Visit (HOSPITAL_BASED_OUTPATIENT_CLINIC_OR_DEPARTMENT_OTHER): Payer: Self-pay | Admitting: Student in an Organized Health Care Education/Training Program

## 2024-04-12 DIAGNOSIS — F39 Unspecified mood [affective] disorder: Secondary | ICD-10-CM

## 2024-04-12 MED ORDER — BUPROPION HCL XL 150 MG 24 HR TABLET, EXTENDED RELEASE
150.0000 mg | ORAL_TABLET | Freq: Every day | ORAL | 3 refills | Status: AC
Start: 2024-04-12 — End: ?

## 2024-04-12 NOTE — Telephone Encounter (Signed)
 Refill Request: Bradley Anderson [facility]  Patient requests refill of Wellbutrin .    1.  Last visit at [facility]:  01/24/2024  2.  Last My Telemed/ My Chart Visit: Visit date not found   3.  Next pending visit [facility]: 04/25/2024  4.  Was last visit within the past year: Yes  5.  If not seen within the last year was appointment scheduled: N/A  6.  [facility]ed [facility]acy: [facility]    Medication pended to provider for approval.    Truman Jacquet, Ambulatory Care Assistant 04/12/2024, 11:27

## 2024-04-25 ENCOUNTER — Encounter (HOSPITAL_BASED_OUTPATIENT_CLINIC_OR_DEPARTMENT_OTHER): Payer: Self-pay | Admitting: Student in an Organized Health Care Education/Training Program

## 2024-04-25 ENCOUNTER — Other Ambulatory Visit: Payer: Self-pay

## 2024-04-25 ENCOUNTER — Ambulatory Visit
Payer: Self-pay | Attending: Student in an Organized Health Care Education/Training Program | Admitting: Student in an Organized Health Care Education/Training Program

## 2024-04-25 VITALS — BP 120/84 | HR 97 | Temp 97.4°F | Ht 70.0 in | Wt 179.9 lb

## 2024-04-25 DIAGNOSIS — F909 Attention-deficit hyperactivity disorder, unspecified type: Secondary | ICD-10-CM | POA: Insufficient documentation

## 2024-04-25 MED ORDER — METHYLPHENIDATE CD 50 MG BIPHASIC 30-70 CAPSULE,EXTENDED RELEASE
50.0000 mg | ORAL_CAPSULE | Freq: Every day | ORAL | 0 refills | Status: DC
Start: 2024-04-25 — End: 2024-06-16

## 2024-04-25 NOTE — Progress Notes (Signed)
 CLINIC PROGRESS NOTE  FAMILY MEDICINE, [facility]  [address] [place] [address]    Bradley Anderson  01-28-2001  [id number]    Date of Service: 04/25/2024  3:40 PM EDT    Chief complaint:   Chief Complaint   Patient presents with    Follow Up       Subjective:   Bradley Anderson is a 23 y.o. male patient who presents for ADHD follow up.   Tolerating methylphenidate  CD 50 mg daily well without side effects. Denies sleep or appetite changes on the medication, no unexplained weight loss, chest pain, heart palpitations, shortness of breath. Feels that medicine continues to work well. No other questions or concerns today.      REVIEW OF SYSTEMS:  ROS as above.      Health Maintenance Due   Topic Date Due    Covid-19 Vaccine (3 - 2024-25 season) 05/23/2023    Depression Screening  09/11/2023       Current Outpatient Medications   Medication Sig    buPROPion  (WELLBUTRIN  XL) 150 mg extended release 24 hr tablet Take 1 Tablet (150 mg total) by mouth Daily    methylphenidate  CD 50 mg Oral Capsule, Multiphasic Rel.30-70 Biphasic Rel.30-70 Take 1 Capsule (50 mg total) by mouth Daily for 30 days    prednisoLONE  acetate (PRED FORTE ) 1 % Ophthalmic Drops, Suspension Administer 1 Drop into the right eye Daily       Objective:   BP 120/84   Pulse 97   Temp 36.3 C (97.4 F) (Thermal Scan)   Ht 1.778 m (5' 10)   Wt 81.6 kg (179 lb 14.3 oz)   SpO2 97%   BMI 25.81 kg/m       General appearance: alert, no acute distress  Lungs: clear to auscultation bilaterally, no wheezes or crackles  Heart: RRR, no murmur appreciated  Abdomen: soft, non-tender, non-distended, BS+, no guarding  Extremities: atraumatic, no edema  Neuro: grossly normal, gait steady    Assessment/Plan     ENCOUNTER DIAGNOSES     ICD-10-CM   1. ADHD (attention deficit hyperactivity disorder)  F90.9     Continue methylphenidate  CD 50 mg daily. PDMP reviewed and appropriate.  The risks and benefits of psychostimulants were discussed, including but not  limited to: insomnia, anorexia, hypertension, tachycardia, nausea, dry mouth, other common side effects, exacerbation of or causing tics, addiction potential, and the risk of sudden cardiac death.      Orders Placed This Encounter    methylphenidate  CD 50 mg Oral Capsule, Multiphasic Rel.30-70 Biphasic Rel.30-70         The patient was given the opportunity to ask questions and those questions were answered to the patient's satisfaction. The patient was encouraged to call with any additional questions or concerns. Instructed patient to call back if symptoms worsen.   Discussed with patient effects and side effects of medications. Medication safety was discussed. A copy of the patient's medication list was printed and given to the patient. A good faith effort was made to reconcile the patient's medications.   This note may have been partially generated using MModal Fluency Direct system and there may be some incorrect words, spellings, and punctuation that were not noted in checking the note before saving.     Follow up on or around: 07/26/2024. Reason for Follow Up: ADHD + wellness       Shraddha Lebron, DO   04/25/2024, 15:53

## 2024-06-08 ENCOUNTER — Ambulatory Visit (HOSPITAL_BASED_OUTPATIENT_CLINIC_OR_DEPARTMENT_OTHER): Payer: Self-pay | Admitting: Student in an Organized Health Care Education/Training Program

## 2024-06-16 ENCOUNTER — Ambulatory Visit: Admitting: Student in an Organized Health Care Education/Training Program

## 2024-06-16 ENCOUNTER — Encounter (HOSPITAL_BASED_OUTPATIENT_CLINIC_OR_DEPARTMENT_OTHER): Payer: Self-pay | Admitting: Student in an Organized Health Care Education/Training Program

## 2024-06-16 ENCOUNTER — Other Ambulatory Visit: Payer: Self-pay

## 2024-06-16 VITALS — BP 120/76 | HR 94 | Temp 97.8°F | Ht 69.0 in | Wt 179.7 lb

## 2024-06-16 DIAGNOSIS — Z0000 Encounter for general adult medical examination without abnormal findings: Secondary | ICD-10-CM | POA: Insufficient documentation

## 2024-06-16 DIAGNOSIS — F909 Attention-deficit hyperactivity disorder, unspecified type: Secondary | ICD-10-CM | POA: Insufficient documentation

## 2024-06-16 DIAGNOSIS — F39 Unspecified mood [affective] disorder: Secondary | ICD-10-CM | POA: Insufficient documentation

## 2024-06-16 MED ORDER — METHYLPHENIDATE CD 50 MG BIPHASIC 30-70 CAPSULE,EXTENDED RELEASE
50.0000 mg | ORAL_CAPSULE | Freq: Every day | ORAL | 0 refills | Status: DC
Start: 2024-07-16 — End: 2024-06-16

## 2024-06-16 MED ORDER — METHYLPHENIDATE CD 50 MG BIPHASIC 30-70 CAPSULE,EXTENDED RELEASE
50.0000 mg | ORAL_CAPSULE | Freq: Every day | ORAL | 0 refills | Status: DC
Start: 2024-06-16 — End: 2024-06-16

## 2024-06-16 MED ORDER — METHYLPHENIDATE CD 50 MG BIPHASIC 30-70 CAPSULE,EXTENDED RELEASE
50.0000 mg | ORAL_CAPSULE | Freq: Every day | ORAL | 0 refills | Status: AC
Start: 2024-08-16 — End: 2024-09-15

## 2024-06-16 NOTE — Progress Notes (Signed)
 CLINIC PROGRESS NOTE  FAMILY MEDICINE, [facility]  [address] [place] [address]    Bradley Anderson  [birth year]  [id number]    Date of Service: 06/16/2024 10:00 AM EDT    Chief complaint:   Chief Complaint   Patient presents with    Annual Wellness Exam     Subjective:     History of Present Illness  Bradley Anderson is a 23 year old male who presents for an annual wellness exam and ADHD follow-up.    He is currently taking methylphenidate  CD 50 mg daily for ADHD. He also takes Wellbutrin  150 mg daily for a mood disorder, which he finds effective. Tolerating both medications well without side effects.     He maintains a healthy lifestyle, working at [employer], following a healthy diet, exercising regularly with one workout session, and sleeping six to eight hours per night. He denies any use of tobacco or alcohol and has no exposure to sexually transmitted diseases. He confirms having a dentist and an eye doctor.    No changes in sleep or appetite, chest pain, shortness of breath, palpitations, fever, chills, cough, nausea, vomiting, diarrhea, unexplained weight loss, sore throat, or leg swelling.      REVIEW OF SYSTEMS:  ROS as above.      PHQ Questionnaire  Little interest or pleasure in doing things.: Not at all  Feeling down, depressed, or hopeless: Not at all  PHQ 2 Total: 0  Trouble falling or staying asleep, or sleeping too much.: Not at all  Feeling tired or having little energy: Not at all  Poor appetite or overeating: Not at all  Feeling bad about yourself/ that you are a failure in the past 2 weeks?: Not at all  Trouble concentrating on things in the past 2 weeks?: Not at all  Moving/Speaking slowly or being fidgety or restless  in the past 2 weeks?: Not at all  Thoughts that you would be better off DEAD, or of hurting yourself in some way.: Not at all  PHQ 9 Total: 0              03/27/2021     8:00 AM 06/16/2024    10:00 AM   GAD-7 Questionnaire   Feeling nervous,anxious,on edge 0 0   Not being  able to stop or control worrying 0 0   Worrying too much about different things 0 0   Trouble relaxing 0 0   Being so restless that it is hard to sit still 0 0   Becoming easily annoyed or irritable 0 1   Feeling afraid as if something awful might happen 0 0   How difficult have these problems made it for you to work, take care of things at home, or get along with other people? Not difficult at all Not difficult at all   Gad-7 Score Total 0 1   Interpretation 0-4, normal                 Health Maintenance Due   Topic Date Due    Hepatitis C screening  Never done    Adult Tdap-Td (7 - Td or Tdap) 05/27/2021    Influenza Vaccine (1) Never done    Covid-19 Vaccine (3 - 2025-26 season) 05/22/2024       Current Outpatient Medications   Medication Sig    buPROPion  (WELLBUTRIN  XL) 150 mg extended release 24 hr tablet Take 1 Tablet (150 mg total) by mouth Daily    [  START ON 08/16/2024] methylphenidate  CD 50 mg Oral Capsule, Multiphasic Rel.30-70 Biphasic Rel.30-70 Take 1 Capsule (50 mg total) by mouth Daily for 30 days    prednisoLONE  acetate (PRED FORTE ) 1 % Ophthalmic Drops, Suspension Administer 1 Drop into the right eye Daily       Objective:   BP 120/76   Pulse 94   Temp 36.6 C (97.8 F) (Thermal Scan)   Ht 1.753 m (5' 9)   Wt 81.5 kg (179 lb 10.8 oz)   SpO2 99%   BMI 26.53 kg/m     General appearance: alert, no acute distress  HEENT: tympanic membranes non erythematous, non-bulging. No lymphadenopathy. Oral and nasal mucosa normal   Lungs: clear to auscultation bilaterally, no wheezes or crackles  Heart: RRR, no murmur appreciated  Abdomen: soft, non-tender, non-distended, BS+, no guarding  Extremities: atraumatic, no edema  Neuro: grossly normal, gait steady    Diabetes Monitors  A1C - Glucose - Lipids Microalbumin   No results for input(s): HA1C, POCTHA1C, GLUCOSEFAST, CHOLESTEROL, HDLCHOL, LDLCHOL, LDLCHOLDIR, TRIG in the last 86859 hours. No results for input(s): MICALBRNUR,  MICALBCRERAT in the last 86859 hours.     Retinal Exam Date: Not Found DM Retinopathy - Negative Date  Last Foot Exam: Not Found      The ASCVD Risk score (Arnett DK, et al., 2019) failed to calculate for the following reasons:    The 2019 ASCVD risk score is only valid for ages 24 to 72    * - Cholesterol units were assumed    Assessment/Plan     ENCOUNTER DIAGNOSES     ICD-10-CM   1. Annual physical exam  Z00.00   2. ADHD (attention deficit hyperactivity disorder)  F90.9   3. Mood disorder (CMS [facility])  F39     Assessment & Plan  Adult Wellness Visit  Annual wellness visit completed. No tobacco, alcohol use. Regular exercise, healthy diet, adequate sleep. No STD exposure, family history of colon cancer. Up to date on dental, eye care. Due for tetanus, COVID booster vaccines. Declined hepatitis C screening.  - Administer tetanus and COVID booster vaccines if desired-he declines today.     Attention-deficit hyperactivity disorder (ADHD)  ADHD well-managed on current medication. No side effects reported.  - Continue methylphenidate  CD 50 mg daily.  - Refill prescription for methylphenidate .  - PDMP reviewed and appropriate.     Mood disorder  Mood disorder well-managed on current medication. No mood changes or side effects reported.  - Continue Wellbutrin  XL 150 mg daily.    -Colorectal cancer: Patient less than 55 yrs old without other indications for colon cancer screening.  -Lung Cancer >50 yr age screen if 20 pack yr current or former quit in past 15 yrs  N\A  -Osteoporosis (18 and older or postmenopausal <65 with risk factor): N/a  -Vit D testing (if community dwelling or high fall risk)No.    Recommended screening guidelines discussed with patient and shared decision making completed.   - Age appropriate screening completed/ordered today.   -Patient counseled today about the following preventive/treatment guideline for her age as above: diet,exercise, health body weight, and alcohol use recommendations.    -Recommend a minimum of thirty minutes of moderate exercise (ie walking at a brisk pace, bicycling, etc.) 4-5 days per week to prevent weight gain from age-related loss of metabolism.  Recommend increasing the intensity (ie running, jogging, and/or resistance training) and/or the duration of time spent exercising to facilitate weight loss and improve cardiac and  respiratory fitness.  - reviewed recommended vaccinations, patient declines at this time.     Health Maintenance: Pending and Last Completed         Date Due Completion Date    Hepatitis C screening Never done ---    Adult Tdap-Td (7 - Td or Tdap) 05/27/2021 05/28/2011    Influenza Vaccine (1) Never done ---    Covid-19 Vaccine (3 - 2025-26 season) 05/22/2024 05/15/2020    Depression Screening 06/16/2025 06/16/2024    NonMedicare Preventative Exam 06/16/2025 06/16/2024          Orders Placed This Encounter    methylphenidate  CD 50 mg Oral Capsule, Multiphasic Rel.30-70 Biphasic Rel.30-70     This note was created with assistance from [employer] via capture of conversational audio.  Consent was obtained from the patient prior to recording.  The patient was given the opportunity to ask questions and those questions were answered to the patient's satisfaction. The patient was encouraged to call with any additional questions or concerns. Instructed patient to call back if symptoms worsen.    Discussed with patient effects and side effects of medications. Medication safety was discussed. A copy of the patient's medication list was printed and given to the patient. A good faith effort was made to reconcile the patient's medications.   This note may have been partially generated using MModal Fluency Direct system and there may be some incorrect words, spellings, and punctuation that were not noted in checking the note before saving.     Follow up on or around: 09/15/2024. Reason for Follow Up: adhd f/u    Adriana Lina, DO   06/16/2024, 09:44

## 2024-08-23 ENCOUNTER — Other Ambulatory Visit (HOSPITAL_BASED_OUTPATIENT_CLINIC_OR_DEPARTMENT_OTHER): Payer: Self-pay | Admitting: Student in an Organized Health Care Education/Training Program

## 2024-08-23 DIAGNOSIS — F909 Attention-deficit hyperactivity disorder, unspecified type: Secondary | ICD-10-CM

## 2024-08-23 DIAGNOSIS — F39 Unspecified mood [affective] disorder: Secondary | ICD-10-CM

## 2024-08-24 MED ORDER — BUPROPION HCL XL 150 MG 24 HR TABLET, EXTENDED RELEASE: 150.0000 mg | ORAL_TABLET | Freq: Every day | ORAL | 3 refills | Status: AC

## 2024-08-24 NOTE — Telephone Encounter (Signed)
 Refill Request: [facility]  Patient requests refill of Methylphenidate .    1.  Last visit at [facility]:  06/16/2024  2.  Last My Telemed/ My Chart Visit: Visit date not found   3.  Next pending visit [facility]: 09/18/2024  4.  Was last visit within the past year: y  5.  If not seen within the last year was appointment scheduled:    6.  Requested Pharmacy: [facility]    Medication pended to provider for approval.    Ozell Day, Ambulatory Care Assistant 08/24/2024, 07:47

## 2024-08-24 NOTE — Telephone Encounter (Signed)
 Refill Request: [facility]  Patient requests refill of Wellbutrin .    1.  Last visit at [facility]:  06/16/2024  2.  Last My Telemed/ My Chart Visit: Visit date not found   3.  Next pending visit [facility]: 09/18/2024  4.  Was last visit within the past year: y  5.  If not seen within the last year was appointment scheduled:    6.  Requested Pharmacy: [facility]    Medication pended to provider for approval.    Ozell Day, Ambulatory Care Assistant 08/24/2024, 07:48

## 2024-09-18 ENCOUNTER — Ambulatory Visit: Payer: Self-pay | Admitting: Student in an Organized Health Care Education/Training Program

## 2024-10-19 ENCOUNTER — Encounter (HOSPITAL_BASED_OUTPATIENT_CLINIC_OR_DEPARTMENT_OTHER): Payer: Self-pay | Admitting: Student in an Organized Health Care Education/Training Program

## 2024-10-19 ENCOUNTER — Ambulatory Visit
Attending: Student in an Organized Health Care Education/Training Program | Admitting: Student in an Organized Health Care Education/Training Program

## 2024-10-19 ENCOUNTER — Other Ambulatory Visit: Payer: Self-pay

## 2024-10-19 VITALS — BP 110/68 | HR 63 | Temp 97.4°F | Ht 70.0 in | Wt 177.7 lb

## 2024-10-19 DIAGNOSIS — F909 Attention-deficit hyperactivity disorder, unspecified type: Secondary | ICD-10-CM | POA: Insufficient documentation

## 2024-10-19 MED ORDER — METHYLPHENIDATE CD 50 MG BIPHASIC 30-70 CAPSULE,EXTENDED RELEASE: 50.0000 mg | ORAL_CAPSULE | Freq: Every day | ORAL | 0 refills | Status: AC

## 2024-10-19 NOTE — Progress Notes (Signed)
 CLINIC PROGRESS NOTE  FAMILY MEDICINE, [facility]  [address] [place] [address]    Bradley Anderson  [birth year]  [id number]    Date of Service: 10/19/2024  9:20 AM EST    Chief complaint:   Chief Complaint   Patient presents with    Annual Wellness Exam       Subjective:   Continue methylphenidate  CD 50mg  daily. PDMP reviewed and appropriate.  The risks and benefits of psychostimulants were discussed, including but not limited to: insomnia, anorexia, hypertension, tachycardia, nausea, dry mouth, other common side effects, exacerbation of or causing tics, addiction potential, and the risk of sudden cardiac death.      REVIEW OF SYSTEMS:  ROS as above.    PHQ Questionnaire  Little interest or pleasure in doing things.: Not at all  Feeling down, depressed, or hopeless: Not at all  PHQ 2 Total: 0      Health Maintenance Due   Topic Date Due    Hepatitis C screening  Never done    Tetanus-Diptheria Vaccines (6 - Tdap) 06/18/2010    Influenza Vaccine (1) Never done    Covid-19 Vaccine (Shared decision making) (3 - 2025-26 season) 05/22/2024       Current Outpatient Medications   Medication Sig    buPROPion  (WELLBUTRIN  XL) 150 mg extended release 24 hr tablet Take 1 Tablet (150 mg total) by mouth Daily    methylphenidate  CD 50 mg Oral Capsule, Multiphasic Rel.30-70 Biphasic Rel.30-70 Take 1 Capsule (50 mg total) by mouth Daily    methylphenidate  CD 50 mg Oral Capsule, Multiphasic Rel.30-70 Biphasic Rel.30-70 Take 1 Capsule (50 mg total) by mouth Daily for 30 days    [START ON 11/18/2024] methylphenidate  CD 50 mg Oral Capsule, Multiphasic Rel.30-70 Biphasic Rel.30-70 Take 1 Capsule (50 mg total) by mouth Daily for 30 days    [START ON 12/16/2024] methylphenidate  CD 50 mg Oral Capsule, Multiphasic Rel.30-70 Biphasic Rel.30-70 Take 1 Capsule (50 mg total) by mouth Daily for 30 days    prednisoLONE  acetate (PRED FORTE ) 1 % Ophthalmic Drops, Suspension Administer 1 Drop into the right eye Daily       Objective:    BP 110/68   Pulse 63   Temp 36.3 C (97.4 F) (Thermal Scan)   Ht 1.778 m (5' 10)   Wt 80.6 kg (177 lb 11.1 oz)   SpO2 98%   BMI 25.50 kg/m     General appearance: alert, no acute distress  Lungs: clear to auscultation bilaterally, no wheezes or crackles  Heart: RRR, no murmur appreciated  Abdomen: soft, non-tender, non-distended, BS+, no guarding  Extremities: atraumatic, no edema  Neuro: grossly normal, gait steady    Assessment/Plan     ENCOUNTER DIAGNOSES     ICD-10-CM   1. ADHD (attention deficit hyperactivity disorder)  F90.9     Continue methylphenidate  CD 50mg  daily. PDMP reviewed and appropriate.  The risks and benefits of psychostimulants were discussed, including but not limited to: insomnia, anorexia, hypertension, tachycardia, nausea, dry mouth, other common side effects, exacerbation of or causing tics, addiction potential, and the risk of sudden cardiac death.      Orders Placed This Encounter    methylphenidate  CD 50 mg Oral Capsule, Multiphasic Rel.30-70 Biphasic Rel.30-70    methylphenidate  CD 50 mg Oral Capsule, Multiphasic Rel.30-70 Biphasic Rel.30-70    methylphenidate  CD 50 mg Oral Capsule, Multiphasic Rel.30-70 Biphasic Rel.30-70     The patient was given the opportunity to ask questions and those  questions were answered to the patient's satisfaction. The patient was encouraged to call with any additional questions or concerns. Instructed patient to call back if symptoms worsen.   Discussed with patient effects and side effects of medications. Medication safety was discussed. A copy of the patient's medication list was printed and given to the patient. A good faith effort was made to reconcile the patient's medications.   This note may have been partially generated using MModal Fluency Direct system and there may be some incorrect words, spellings, and punctuation that were not noted in checking the note before saving.     Follow up in about 3 months (around 01/17/2025).    Louiza Moor, DO   10/19/2024, 09:05

## 2025-01-18 ENCOUNTER — Ambulatory Visit (HOSPITAL_BASED_OUTPATIENT_CLINIC_OR_DEPARTMENT_OTHER): Payer: Self-pay | Admitting: Student in an Organized Health Care Education/Training Program
